# Patient Record
Sex: Male | Born: 1950 | Race: White | Hispanic: No
Health system: Southern US, Community
[De-identification: ages and names within clinical notes are randomized; demographics above are authoritative.]

## PROBLEM LIST (undated history)

## (undated) DIAGNOSIS — I639 Cerebral infarction, unspecified: Secondary | ICD-10-CM

---

## 2015-11-25 ENCOUNTER — Encounter (HOSPITAL_COMMUNITY): Payer: Self-pay | Admitting: Internal Medicine

## 2015-11-25 ENCOUNTER — Inpatient Hospital Stay (HOSPITAL_COMMUNITY)
Admission: AD | Admit: 2015-11-25 | Discharge: 2015-11-29 | DRG: 064 | Disposition: A | Discharge status: Skilled Nursing Facility | Payer: Medicaid Other | Source: Other Acute Inpatient Hospital | Attending: Internal Medicine | Admitting: Internal Medicine

## 2015-11-25 DIAGNOSIS — N390 Urinary tract infection, site not specified: Secondary | ICD-10-CM | POA: Diagnosis present

## 2015-11-25 DIAGNOSIS — L8990 Pressure ulcer of unspecified site, unspecified stage: Secondary | ICD-10-CM | POA: Diagnosis present

## 2015-11-25 DIAGNOSIS — I639 Cerebral infarction, unspecified: Secondary | ICD-10-CM | POA: Diagnosis not present

## 2015-11-25 DIAGNOSIS — R0600 Dyspnea, unspecified: Secondary | ICD-10-CM | POA: Diagnosis not present

## 2015-11-25 DIAGNOSIS — I6340 Cerebral infarction due to embolism of unspecified cerebral artery: Principal | ICD-10-CM | POA: Diagnosis present

## 2015-11-25 DIAGNOSIS — R059 Cough, unspecified: Secondary | ICD-10-CM

## 2015-11-25 DIAGNOSIS — I10 Essential (primary) hypertension: Secondary | ICD-10-CM | POA: Diagnosis present

## 2015-11-25 DIAGNOSIS — R627 Adult failure to thrive: Secondary | ICD-10-CM | POA: Diagnosis present

## 2015-11-25 DIAGNOSIS — R269 Unspecified abnormalities of gait and mobility: Secondary | ICD-10-CM | POA: Diagnosis not present

## 2015-11-25 DIAGNOSIS — I63412 Cerebral infarction due to embolism of left middle cerebral artery: Secondary | ICD-10-CM | POA: Diagnosis not present

## 2015-11-25 DIAGNOSIS — Z6820 Body mass index (BMI) 20.0-20.9, adult: Secondary | ICD-10-CM | POA: Diagnosis not present

## 2015-11-25 DIAGNOSIS — M40209 Unspecified kyphosis, site unspecified: Secondary | ICD-10-CM | POA: Diagnosis present

## 2015-11-25 DIAGNOSIS — F0390 Unspecified dementia without behavioral disturbance: Secondary | ICD-10-CM | POA: Diagnosis present

## 2015-11-25 DIAGNOSIS — E43 Unspecified severe protein-calorie malnutrition: Secondary | ICD-10-CM | POA: Diagnosis present

## 2015-11-25 DIAGNOSIS — R64 Cachexia: Secondary | ICD-10-CM | POA: Diagnosis present

## 2015-11-25 DIAGNOSIS — G912 (Idiopathic) normal pressure hydrocephalus: Secondary | ICD-10-CM | POA: Diagnosis present

## 2015-11-25 DIAGNOSIS — Z720 Tobacco use: Secondary | ICD-10-CM | POA: Diagnosis present

## 2015-11-25 DIAGNOSIS — R930 Abnormal findings on diagnostic imaging of skull and head, not elsewhere classified: Secondary | ICD-10-CM | POA: Diagnosis not present

## 2015-11-25 DIAGNOSIS — R54 Age-related physical debility: Secondary | ICD-10-CM | POA: Diagnosis present

## 2015-11-25 DIAGNOSIS — Z8619 Personal history of other infectious and parasitic diseases: Secondary | ICD-10-CM

## 2015-11-25 DIAGNOSIS — R05 Cough: Secondary | ICD-10-CM

## 2015-11-25 DIAGNOSIS — J449 Chronic obstructive pulmonary disease, unspecified: Secondary | ICD-10-CM | POA: Diagnosis present

## 2015-11-25 DIAGNOSIS — R0602 Shortness of breath: Secondary | ICD-10-CM

## 2015-11-25 DIAGNOSIS — Z8673 Personal history of transient ischemic attack (TIA), and cerebral infarction without residual deficits: Secondary | ICD-10-CM | POA: Diagnosis not present

## 2015-11-25 DIAGNOSIS — E785 Hyperlipidemia, unspecified: Secondary | ICD-10-CM | POA: Diagnosis present

## 2015-11-25 DIAGNOSIS — R296 Repeated falls: Secondary | ICD-10-CM

## 2015-11-25 DIAGNOSIS — L89222 Pressure ulcer of left hip, stage 2: Secondary | ICD-10-CM | POA: Diagnosis present

## 2015-11-25 DIAGNOSIS — R2681 Unsteadiness on feet: Secondary | ICD-10-CM

## 2015-11-25 DIAGNOSIS — J439 Emphysema, unspecified: Secondary | ICD-10-CM | POA: Diagnosis present

## 2015-11-25 HISTORY — DX: Cerebral infarction, unspecified: I63.9

## 2015-11-25 MED ORDER — ALPRAZOLAM 0.5 MG PO TABS
0.5000 mg | ORAL_TABLET | Freq: Once | ORAL | Status: AC | PRN
  Administered 2015-11-26: 0.5 mg via ORAL
  Filled 2015-11-25: qty 1

## 2015-11-25 NOTE — H&P (Addendum)
[facility] History and Physical  Cody NoonStephan Labell ZOX:[id number] DOB: 12/14/1950 DOA: 11/25/2015  Referring physician: [facility]. PCP: No primary care provider on file.   Chief Complaint: Abnormal CT scan.  HPI: Cody Lloyd is a 65 y.o. male with a past medical history of, secondary to his stroke with subsequent decreasing intellectual abilities who was [facility] by [facility] for questionable NPH finding in CT scan of the head.  [facility], patient was seen for the second time in the last 3 days in the emergency department. Earlier today, one of his neighbors was concerned, because the patient was not answering his daughter. Subsequently neighbor called help and the patient was found over in urine and feces on the floor. He apparently has been having frequent falls lately. At his baseline, the patient was independent enough to drive a vehicle and do grocery shopping, but apparently has not been eating much lately and seems to be unable to live independently at this time.  [facility] showed a negative CT chest angiogram for PE or pneumonia with no interval change from the one done earlier this week. His CT scan of the head showed involutional changes with prominent ventricles, NPH could not be ruled out. He was transferred to this facility for further evaluation and neurology consult.   Review of Systems:  Unable to review.  History reviewed. No pertinent past medical history. History reviewed. No pertinent past surgical history. Social History:  has no tobacco, alcohol, and drug history on file.  Allergies not on file  History reviewed. No pertinent family history.   Prior to Admission medications   Not on File   Physical Exam: Filed Vitals:   11/25/15 2200  BP: 119/63  Pulse: 72  Temp: 98.1 F (36.7 C)  TempSrc: Oral  Resp: 22  SpO2: 100%    Wt Readings from Last 3 Encounters:  No data found  for Wt    General:  Appears calm and comfortable. Poor physical hygiene.  Eyes: PERRL, normal lids, irises & conjunctiva ENT: grossly normal hearing, lips & tongue Neck: no LAD, masses or thyromegaly Cardiovascular: RRR, no m/r/g. No LE edema. Telemetry: SR, no arrhythmias  Respiratory: Decreased breath sounds at the bases, mildly prolonged expiratory phase, Mild wheezing bilaterally, no accessory muscle use. Abdomen: soft, ntnd Skin: Left hip area dressing in place. Musculoskeletal: grossly normal tone BUE/BLE Psychiatric: grossly normal mood and affect, speech fluent and appropriate Neurologic: Awake, alert, oriented 2, disoriented to time, grossly non-focal.          Labs on Admission:    Assessment/Plan Principal Problem:   Failure to thrive in adult Admit to telemetry/inpatient. Continue supplemental oxygen. Consult social services and care management in a.m.  Active Problems:   Emphysema/COPD (HCC) Continue supplemental oxygen. Bronchodilators as needed.    Tobacco abuse disorder The patient declined nicotine replacement therapy.    Frequent falls Physical therapy evaluation in a.m. Care management and social services consults.    Abnormal CT scan of head Discussed possible NPH findings with Dr. Amada JupiterKirkpatrick, neuro hospitalist, who recommended to evaluate with MRI of the head first and call the neuro hospitalist service, If MRI shows some abnormalities.    Left hip pressure ulcer. Continue local care.   Code Status: Full code. DVT Prophylaxis: Lovenox SQ. Family Communication:  Disposition Plan: Admit for further evaluation and treatment.  Time spent: Over 70 minutes were spent in the process of this admission.  Bobette Moavid Manuel Ortiz, M.D. [facility] Pager [phone].

## 2015-11-26 ENCOUNTER — Ambulatory Visit (HOSPITAL_COMMUNITY): Payer: Medicaid Other

## 2015-11-26 ENCOUNTER — Encounter (HOSPITAL_COMMUNITY): Payer: Self-pay | Admitting: Neurology

## 2015-11-26 ENCOUNTER — Inpatient Hospital Stay (HOSPITAL_COMMUNITY): Payer: Medicaid Other

## 2015-11-26 DIAGNOSIS — L8990 Pressure ulcer of unspecified site, unspecified stage: Secondary | ICD-10-CM | POA: Diagnosis present

## 2015-11-26 DIAGNOSIS — R627 Adult failure to thrive: Secondary | ICD-10-CM

## 2015-11-26 DIAGNOSIS — I639 Cerebral infarction, unspecified: Secondary | ICD-10-CM

## 2015-11-26 DIAGNOSIS — I63412 Cerebral infarction due to embolism of left middle cerebral artery: Secondary | ICD-10-CM

## 2015-11-26 DIAGNOSIS — R930 Abnormal findings on diagnostic imaging of skull and head, not elsewhere classified: Secondary | ICD-10-CM | POA: Diagnosis present

## 2015-11-26 LAB — COMPREHENSIVE METABOLIC PANEL
ALBUMIN: 2.7 g/dL — AB (ref 3.5–5.0)
ALT: 33 U/L (ref 17–63)
ANION GAP: 9 (ref 5–15)
AST: 34 U/L (ref 15–41)
Alkaline Phosphatase: 62 U/L (ref 38–126)
BILIRUBIN TOTAL: 0.6 mg/dL (ref 0.3–1.2)
BUN: 13 mg/dL (ref 6–20)
CHLORIDE: 99 mmol/L — AB (ref 101–111)
CO2: 29 mmol/L (ref 22–32)
Calcium: 8.8 mg/dL — ABNORMAL LOW (ref 8.9–10.3)
Creatinine, Ser: 0.45 mg/dL — ABNORMAL LOW (ref 0.61–1.24)
GFR calc Af Amer: >60 mL/min (ref >60)
GLUCOSE: 92 mg/dL (ref 65–99)
POTASSIUM: 3.7 mmol/L (ref 3.5–5.1)
Sodium: 137 mmol/L (ref 135–145)
Total Protein: 5.6 g/dL — ABNORMAL LOW (ref 6.5–8.1)

## 2015-11-26 LAB — LIPID PANEL
Cholesterol: 144 mg/dL (ref 0–200)
HDL: 45 mg/dL (ref >40)
LDL CALC: 89 mg/dL (ref 0–99)
TRIGLYCERIDES: 50 mg/dL (ref <150)
Total CHOL/HDL Ratio: 3.2 RATIO
VLDL: 10 mg/dL (ref 0–40)

## 2015-11-26 LAB — VITAMIN B12: VITAMIN B 12: 393 pg/mL (ref 180–914)

## 2015-11-26 LAB — CBC
HEMATOCRIT: 34.3 % — AB (ref 39.0–52.0)
Hemoglobin: 11.2 g/dL — ABNORMAL LOW (ref 13.0–17.0)
MCH: 30.2 pg (ref 26.0–34.0)
MCHC: 32.7 g/dL (ref 30.0–36.0)
MCV: 92.5 fL (ref 78.0–100.0)
PLATELETS: 352 10*3/uL (ref 150–400)
RBC: 3.71 MIL/uL — ABNORMAL LOW (ref 4.22–5.81)
RDW: 14.5 % (ref 11.5–15.5)
WBC: 18.1 10*3/uL — AB (ref 4.0–10.5)

## 2015-11-26 LAB — URINALYSIS, ROUTINE W REFLEX MICROSCOPIC
Bilirubin Urine: NEGATIVE
Glucose, UA: NEGATIVE mg/dL
HGB URINE DIPSTICK: NEGATIVE
Ketones, ur: 15 mg/dL — AB
Nitrite: NEGATIVE
PH: 8 (ref 5.0–8.0)
Protein, ur: 30 mg/dL — AB
SPECIFIC GRAVITY, URINE: 1.023 (ref 1.005–1.030)

## 2015-11-26 LAB — MAGNESIUM: Magnesium: 1.6 mg/dL — ABNORMAL LOW (ref 1.7–2.4)

## 2015-11-26 LAB — URINE MICROSCOPIC-ADD ON: RBC / HPF: NONE SEEN RBC/hpf (ref 0–5)

## 2015-11-26 LAB — INFLUENZA PANEL BY PCR (TYPE A & B)
H1N1FLUPCR: NOT DETECTED
INFLAPCR: NEGATIVE
Influenza B By PCR: NEGATIVE

## 2015-11-26 LAB — TSH: TSH: 1.413 u[IU]/mL (ref 0.350–4.500)

## 2015-11-26 LAB — PHOSPHORUS: Phosphorus: 2.7 mg/dL (ref 2.5–4.6)

## 2015-11-26 MED ORDER — ALBUTEROL SULFATE (2.5 MG/3ML) 0.083% IN NEBU: 2.5000 mg | INHALATION_SOLUTION | RESPIRATORY_TRACT | Status: DC | PRN

## 2015-11-26 MED ORDER — DEXTROSE 5 % IV SOLN
1.0000 g | INTRAVENOUS | Status: DC
  Administered 2015-11-26 – 2015-11-27 (×2): 1 g via INTRAVENOUS
  Filled 2015-11-26 (×3): qty 10

## 2015-11-26 MED ORDER — ASPIRIN EC 325 MG PO TBEC
325.0000 mg | DELAYED_RELEASE_TABLET | Freq: Every day | ORAL | Status: DC
  Administered 2015-11-26 – 2015-11-29 (×4): 325 mg via ORAL
  Filled 2015-11-26 (×4): qty 1

## 2015-11-26 MED ORDER — STROKE: EARLY STAGES OF RECOVERY BOOK
Freq: Once | Status: AC
  Administered 2015-11-26: 07:00:00
  Filled 2015-11-26: qty 1

## 2015-11-26 MED ORDER — INFLUENZA VAC SPLIT QUAD 0.5 ML IM SUSY
0.5000 mL | PREFILLED_SYRINGE | INTRAMUSCULAR | Status: AC
  Administered 2015-11-29: 0.5 mL via INTRAMUSCULAR

## 2015-11-26 MED ORDER — PRO-STAT SUGAR FREE PO LIQD
30.0000 mL | Freq: Three times a day (TID) | ORAL | Status: DC
Start: 2015-11-26 — End: 2015-11-29
  Administered 2015-11-26 – 2015-11-29 (×8): 30 mL via ORAL
  Filled 2015-11-26 (×7): qty 30

## 2015-11-26 MED ORDER — PNEUMOCOCCAL VAC POLYVALENT 25 MCG/0.5ML IJ INJ
0.5000 mL | INJECTION | INTRAMUSCULAR | Status: DC
  Filled 2015-11-26: qty 1
  Filled 2015-11-26: qty 0.5

## 2015-11-26 MED ORDER — MAGNESIUM SULFATE 2 GM/50ML IV SOLN
2.0000 g | Freq: Once | INTRAVENOUS | Status: AC
  Administered 2015-11-26: 2 g via INTRAVENOUS
  Filled 2015-11-26: qty 50

## 2015-11-26 MED ORDER — IPRATROPIUM-ALBUTEROL 0.5-2.5 (3) MG/3ML IN SOLN: 3.0000 mL | RESPIRATORY_TRACT | Status: DC | PRN

## 2015-11-26 MED ORDER — IPRATROPIUM-ALBUTEROL 0.5-2.5 (3) MG/3ML IN SOLN: 3.0000 mL | Freq: Four times a day (QID) | RESPIRATORY_TRACT | Status: DC

## 2015-11-26 MED ORDER — ENOXAPARIN SODIUM 40 MG/0.4ML ~~LOC~~ SOLN
40.0000 mg | SUBCUTANEOUS | Status: DC
  Administered 2015-11-26 – 2015-11-28 (×3): 40 mg via SUBCUTANEOUS
  Filled 2015-11-26 (×2): qty 0.4

## 2015-11-26 MED ORDER — ENSURE ENLIVE PO LIQD
237.0000 mL | Freq: Two times a day (BID) | ORAL | Status: DC
  Administered 2015-11-27 – 2015-11-28 (×4): 237 mL via ORAL

## 2015-11-26 MED ORDER — SODIUM CHLORIDE 0.9% FLUSH
3.0000 mL | Freq: Two times a day (BID) | INTRAVENOUS | Status: DC
  Administered 2015-11-26 – 2015-11-28 (×3): 3 mL via INTRAVENOUS

## 2015-11-26 MED ORDER — ATORVASTATIN CALCIUM 40 MG PO TABS
40.0000 mg | ORAL_TABLET | Freq: Every day | ORAL | Status: DC
  Administered 2015-11-26 – 2015-11-29 (×4): 40 mg via ORAL
  Filled 2015-11-26 (×4): qty 1

## 2015-11-26 NOTE — Progress Notes (Signed)
Occupational Therapy Evaluation Patient Details Name: Cody Lloyd MRN: [id number] DOB: 11/18/1950 Today's Date: 11/26/2015    History of Present Illness Cody Lloyd is a 65 y.o. male with a past medical history of, secondary to his stroke with subsequent decreasing intellectual abilities who was referred by [facility] for questionable NPH finding in CT scan of the head.   Clinical Impression   PTA, pt apparently living at home. Pt unsafe and unable to manage at home at this time and will need SNF for rehab. Limited eval due to lethargy. Will follow acutely and initially focus on self feeding goals.     Follow Up Recommendations  SNF;Supervision/Assistance - 24 hour    Equipment Recommendations  Other (comment) (TBA at SNF)    Recommendations for Other Services       Precautions / Restrictions Precautions Precautions: Fall Restrictions Weight Bearing Restrictions: No      Mobility Bed Mobility   Bed Mobility: Supine to Sit;Sit to Supine Rolling: Mod assist   Supine to sit: Mod assist  Pt sat for @ 2 min they reclined dispite encouragement to participate with OT        Transfers Overall transfer level: Needs assistance Equipment used:  (2 person lift with gait belt)             General transfer comment: Unable to get pt to participate in further mobiliy. Per PT,pt Max A +2    Balance     Sitting balance-Leahy Scale: Fair                                      ADL Overall ADL's : Needs assistance/impaired                                       General ADL Comments: Pt washed face then threw cloth on floor. Unable ot engage pt in any other ADL task. Pt did sit EOB with mod A.      Vision     Perception     Praxis      Pertinent Vitals/Pain Pain Assessment: Faces Faces Pain Scale: No hurt     Hand Dominance Right   Extremity/Trunk Assessment Upper Extremity Assessment Upper Extremity Assessment:  Generalized weakness   Lower Extremity Assessment Lower Extremity Assessment: Generalized weakness   Cervical / Trunk Assessment Cervical / Trunk Assessment: Kyphotic   Communication Communication Communication: HOH   Cognition Arousal/Alertness: Lethargic Behavior During Therapy: Flat affect Overall Cognitive Status: Impaired/Different from baseline Area of Impairment: Orientation;Attention;Memory;Following commands;Safety/judgement;Awareness;Problem solving Orientation Level: Disoriented to;Time;Situation;Place Current Attention Level: Focused Memory: Decreased short-term memory Following Commands: Follows one step commands inconsistently;Follows one step commands with increased time Safety/Judgement: Decreased awareness of safety;Decreased awareness of deficits Awareness: Intellectual Problem Solving: Slow processing;Decreased initiation;Difficulty sequencing;Requires verbal cues;Requires tactile cues General Comments: Pt more lethargic this pm.    General Comments       Exercises       Shoulder Instructions      Home Living Family/patient expects to be discharged to:: Skilled nursing facility Living Arrangements: Alone                               Additional Comments: per sister Cody Lloyd, pt living alone but wouldn't let her in for the  last 6 months. per sister home has a flight of stairs to enter and is a disaster, will need a hazmat team      Prior Functioning/Environment Level of Independence: Needs assistance  Gait / Transfers Assistance Needed: per sister pt was ambulating without AD ADL's / Homemaking Assistance Needed: tubshower but wasn't bathing, water was shut off for last 2 weeks   Comments: didn't drive, was having neighbors do grocery shopping    OT Diagnosis: Generalized weakness;Cognitive deficits;Altered mental status   OT Problem List: Decreased strength;Decreased activity tolerance;Impaired balance (sitting and/or standing);Decreased  coordination;Decreased cognition;Decreased safety awareness;Decreased knowledge of use of DME or AE   OT Treatment/Interventions: Self-care/ADL training;Neuromuscular education;Therapeutic exercise;Therapeutic activities;Cognitive remediation/compensation;Patient/family education;Balance training    OT Goals(Current goals can be found in the care plan section) Acute Rehab OT Goals Patient Stated Goal: didn't state OT Goal Formulation: Patient unable to participate in goal setting Time For Goal Achievement: 12/10/15 Potential to Achieve Goals: Good  OT Frequency: Min 2X/week   Barriers to D/C: Decreased caregiver support          Co-evaluation              End of Session Nurse Communication: Mobility status  Activity Tolerance: Patient limited by lethargy Patient left: in bed;with call bell/phone within reach;with bed alarm set   Time: 1610-9604 OT Time Calculation (min): 14 min Charges:  OT General Charges $OT Visit: 1 Procedure OT Evaluation $OT Eval Moderate Complexity: 1 Procedure G-Codes:    Phynix Horton,HILLARY 2015/12/25, 4:40 PM  [facility], OTR/L  [phone] 12/25/15

## 2015-11-26 NOTE — Evaluation (Signed)
Clinical/Bedside Swallow Evaluation Patient Details  Name: Cody Lloyd MRN: [id number] Date of Birth: 08-07-1951  Today's Date: 11/26/2015 Time: SLP Start Time (ACUTE ONLY): 1410 SLP Stop Time (ACUTE ONLY): 1433 SLP Time Calculation (min) (ACUTE ONLY): 23 min  Past Medical History:  Past Medical History  Diagnosis Date  . Stroke (cerebrum) [facility])    Past Surgical History: History reviewed. No pertinent past surgical history. HPI:  Cody Lloyd is a 65 y.o. male with a past medical history of decreasing intellectual abilities secondary to stroke who was referred by [facility] for questionable NPH finding in CT scan of the head.  Per [facility] records, patient was seen for the second time in the last 3 days in the emergency department. Earlier today, one of his neighbors was concerned, because the patient was not answering his daughter. Subsequently neighbor called help and the patient was found down in urine and feces on the floor. He apparently has been having frequent falls lately. At his baseline, the patient was independent enough to drive a vehicle and do grocery shopping, but apparently has not been eating much lately and seems to be unable to live independently at this time.  Workup at [facility] showed a negative CT chest angiogram for PE or pneumonia with no interval change from the one done earlier this week. His CT scan of the head showed involutional changes with prominent ventricles, NPH could not be ruled out. He was transferred to this facility for further evaluation and neurology consult.  MRI dated 11/26/2015 showing punctate probable acute infarct LEFT centrum semiovale,  moderate ventriculomegaly, consistent with mild brain atrophy for age and, a component normal pressure hydrocephalus, bifrontal encephalomalacia in a pattern compatible with prior head injury and moderate to severe chronic small vessel ischemic disease.   Assessment / Plan /  Recommendation Clinical Impression  Clinical swallowing evaluation was completed.  The patient demosntrated very mild oropharyngeal dysphagia characterized by mildly delayed swallow trigger and mildly delayed oral transit for dry solids.  Overt s/s of aspiration were not observed.  Given the patient's current mental status he is at risk for aspiration.  Recommend a regular diet and thin liquids.  ST to follow up for therapeutic diet tolerance.  MD please consider speech/language evaluation to fully assess cognition.  Thank you.     Aspiration Risk  Mild aspiration risk    Diet Recommendation   Regular diet with thin liquids.    Medication Administration: Whole meds with puree    Other  Recommendations Oral Care Recommendations: Oral care BID   Follow up Recommendations   (TBD)    Frequency and Duration min 2x/week  2 weeks       Prognosis Prognosis for Safe Diet Advancement: Good Barriers to Reach Goals: Cognitive deficits      Swallow Study   General Date of Onset: 11/25/15 HPI: Cody Lloyd is a 65 y.o. male with a past medical history of decreasing intellectual abilities secondary to stroke who was referred by [facility] for questionable NPH finding in CT scan of the head.  Per [facility] records, patient was seen for the second time in the last 3 days in the emergency department. Earlier today, one of his neighbors was concerned, because the patient was not answering his daughter. Subsequently neighbor called help and the patient was found down in urine and feces on the floor. He apparently has been having frequent falls lately. At his baseline, the patient was independent enough to drive a vehicle  and do grocery shopping, but apparently has not been eating much lately and seems to be unable to live independently at this time.  [facility] showed a negative CT chest angiogram for PE or pneumonia with no interval change from the one done earlier this  week. His CT scan of the head showed involutional changes with prominent ventricles, NPH could not be ruled out. He was transferred to this facility for further evaluation and neurology consult.  MRI dated 11/26/2015 showing punctate probable acute infarct LEFT centrum semiovale,  moderate ventriculomegaly, consistent with mild brain atrophy for age and, a component normal pressure hydrocephalus, bifrontal encephalomalacia in a pattern compatible with prior head injury and moderate to severe chronic small vessel ischemic disease. Type of Study: Bedside Swallow Evaluation Previous Swallow Assessment: None noted.   Diet Prior to this Study: Regular;Thin liquids Temperature Spikes Noted: No Respiratory Status: Room air History of Recent Intubation: No Behavior/Cognition: Alert;Cooperative;Confused Oral Cavity Assessment: Within Functional Limits Oral Care Completed by SLP: No Oral Cavity - Dentition: Adequate natural dentition Vision: Functional for self-feeding Self-Feeding Abilities: Able to feed self Patient Positioning: Upright in bed Baseline Vocal Quality: Normal Volitional Cough: Strong Volitional Swallow: Able to elicit    Oral/Motor/Sensory Function Overall Oral Motor/Sensory Function: Within functional limits   Ice Chips Ice chips: Not tested   Thin Liquid Thin Liquid: Impaired Presentation: Cup;Spoon;Self Fed Pharyngeal  Phase Impairments: Suspected delayed Swallow    Nectar Thick Nectar Thick Liquid: Not tested   Honey Thick Honey Thick Liquid: Not tested   Puree Puree: Within functional limits Presentation: Spoon   Solid   GO   Solid: Impaired Presentation: Self Fed Oral Phase Impairments: Impaired mastication Oral Phase Functional Implications: Prolonged oral transit    Functional Assessment Tool Used: ASHA NOMS and clinical judgment.   Functional Limitations: Swallowing Swallow Current Status [phone](G8996): At least 1 percent but less than 20 percent impaired, limited or  restricted Swallow Goal Status [phone](G8997): At least 1 percent but less than 20 percent impaired, limited or restricted  Cody AguasMelissa Yaquelin Langelier, MA, CCC-SLP Acute Rehab SLP [phone] Cody AguasGoodman, Cody Billey N 11/26/2015,2:39 PM

## 2015-11-26 NOTE — Progress Notes (Signed)
Respiratory assessment done and patient scored a 6. Patient has history but takes no meds taken at home patient states. BBS heard at this time are clear throughout and diminished in the bases. Non productive cough at this time. Patient sating 96% on 2L/[place] and doing well. Assessed to PRN at this time and will reassess if needed.

## 2015-11-26 NOTE — Progress Notes (Signed)
Patient Demographics:    Cody Lloyd, is a 65 y.o. male, DOB - 12/04/1950, RUE:[id number]  Admit date - 11/25/2015   Admitting Physician Houston SirenPeter Le, MD  Outpatient Primary MD for the patient is No primary care provider on file.  LOS - 1   No chief complaint on file.       Subjective:    Cody Lloyd today has, No headache, No chest pain, No abdominal pain - No Nausea, No new weakness tingling or numbness, No Cough - SOB.    Assessment  & Plan :     1.Failure to thrive and generalized weakness due to acute left centrum semiovale stroke along with undiagnosed NPH. Patient appears frail and cachectic, appears to be poorly kept, apparently lives alone and has had multiple falls recently.  Has been placed on aspirin, Lipitor, full stroke workup, neuro called. LDL was greater than 70 hence started on statin, A1c pending, echogram and carotid duplex pending as well.   2. Failure to thrive, cachexia, multiple falls recently. PT, OT eval, place on nutritional supplements, appears to have severe protein calorie malnutrition as well. Will likely require placement.  3. Dyslipidemia. Placed on statin.  4. Dysuria with leukocytosis. Check UA, placed on Rocephin.  5. Hypomagnesemia. Replace IV and recheck.  6. Smoking. Counseled to quit.  7. History of COPD. Stable, supportive care only at this time. No wheezing.   8. Left hip pressure ulcer. Kindly see wound care note for details, one care consulted.    Code Status : Full  Family Communication  : None present  Disposition Plan  : SNF  Consults  :  Neuro  Procedures  :   MRI brain. Acute left centrum semiovale stroke. Likely mild NPH  Echogram  Carotid duplex  DVT Prophylaxis  :  Lovenox    Lab Results  Component Value Date   PLT  352 11/26/2015    Inpatient Medications  Scheduled Meds: .  stroke: mapping our early stages of recovery book   Does not apply Once  . aspirin EC  325 mg Oral Daily  . atorvastatin  40 mg Oral q1800  . cefTRIAXone (ROCEPHIN)  IV  1 g Intravenous Q24H  . enoxaparin (LOVENOX) injection  40 mg Subcutaneous Q24H  . [START ON 11/27/2015] Influenza vac split quadrivalent PF  0.5 mL Intramuscular Tomorrow-1000  . [START ON 11/27/2015] pneumococcal 23 valent vaccine  0.5 mL Intramuscular Tomorrow-1000  . sodium chloride flush  3 mL Intravenous Q12H   Continuous Infusions:  PRN Meds:.ipratropium-albuterol  Antibiotics  :     Anti-infectives    Start     Dose/Rate Route Frequency Ordered Stop   11/26/15 1200  cefTRIAXone (ROCEPHIN) 1 g in dextrose 5 % 50 mL IVPB     1 g 100 mL/hr over 30 Minutes Intravenous Every 24 hours 11/26/15 1020 12/01/15 1159        Objective:   Filed Vitals:   11/25/15 2200 11/26/15 0001 11/26/15 0826  BP: 119/63  126/67  Pulse: 72  83  Temp: 98.1 F (36.7 C)  97.7 F (36.5 C)  TempSrc: Oral  Oral  Resp: 22  17  SpO2: 100% 96% 95%    Wt Readings from Last 3 Encounters:  No  data found for Wt    No intake or output data in the 24 hours ending 11/26/15 1028   Physical Exam  Frail elderly, cachectic appearing white male  Awake, Pleasantly confused, No new F.N deficits, Normal affect [place].AT,PERRAL Supple Neck,No JVD, No cervical lymphadenopathy appriciated.  Symmetrical Chest wall movement, Good air movement bilaterally, CTAB RRR,No Gallops,Rubs or new Murmurs, No Parasternal Heave +ve B.Sounds, Abd Soft, No tenderness, No organomegaly appriciated, No rebound - guarding or rigidity. No Cyanosis, Clubbing or edema, No new Rash or bruise       Data Review:   Micro Results No results found for this or any previous visit (from the past 240 hour(s)).  Radiology Reports Mr Brain Wo Contrast  11/26/2015  CLINICAL DATA:  Decreasing intellectual  abilities, assess possible normal pressure hydrocephalus. Frequent falls, failure to thrive. EXAM: MRI HEAD WITHOUT CONTRAST TECHNIQUE: Multiplanar, multiecho pulse sequences of the brain and surrounding structures were obtained without intravenous contrast. COMPARISON:  CT head November 22, 2015 and November 23, 2015 FINDINGS: Multiple sequences are moderately motion degraded. Punctate focus of reduced diffusion LEFT frontal centrum semiovale, difficult to localize on ADC value due to mild patient motion and small size. No susceptibility artifact to suggest hemorrhage. Moderate to severe ventriculomegaly, disproportionate to degree of sulcal effacement. Patchy to confluent supratentorial/periventricular white matter T2 hyperintensities, in addition to mild pontine T2 hyperintense signal. Old small RIGHT cerebellar infarcts. Bifrontal multifocal encephalomalacia. No midline shift, mass effect or mass lesions though motion degrades sensitivity. No abnormal extra-axial fluid collections. Basal cisterns are patent. dolicoectatic intracranial vascular flow voids present at skullbase. Mild paranasal sinus mucosal thickening without air-fluid levels. The mastoid air cells are well aerated. No abnormal sellar expansion. No cerebellar tonsillar ectopia. No suspicious calvarial bone marrow signal. IMPRESSION: Moderately motion degraded examination. Punctate probable acute infarct LEFT centrum semiovale. Moderate ventriculomegaly, consistent with mild brain atrophy for age and, a component normal pressure hydrocephalus. Bifrontal encephalomalacia in a pattern compatible with prior head injury. Moderate to severe chronic small vessel ischemic disease. Electronically Signed   By: Awilda Metro M.D.   On: 11/26/2015 05:08   Dg Chest Port 1 View  11/26/2015  CLINICAL DATA:  Cough and shortness of breath. EXAM: PORTABLE CHEST 1 VIEW COMPARISON:  CT chest 11/25/2015. Single view of the chest 11/24/2015. FINDINGS: The lungs are  markedly emphysematous. Mild basilar atelectasis is noted. No pneumothorax or pleural effusion. Heart size is normal. IMPRESSION: Severe emphysema with bibasilar atelectasis. Electronically Signed   By: Drusilla Kanner M.D.   On: 11/26/2015 07:46     CBC  Recent Labs Lab 11/26/15 0702  WBC 18.1*  HGB 11.2*  HCT 34.3*  PLT 352  MCV 92.5  MCH 30.2  MCHC 32.7  RDW 14.5    Chemistries   Recent Labs Lab 11/26/15 0702  NA 137  K 3.7  CL 99*  CO2 29  GLUCOSE 92  BUN 13  CREATININE 0.45*  CALCIUM 8.8*  MG 1.6*  AST 34  ALT 33  ALKPHOS 62  BILITOT 0.6   ------------------------------------------------------------------------------------------------------------------  Recent Labs  11/26/15 0702  CHOL 144  HDL 45  LDLCALC 89  TRIG 50  CHOLHDL 3.2    No results found for: HGBA1C ------------------------------------------------------------------------------------------------------------------  Recent Labs  11/26/15 0702  TSH 1.413   ------------------------------------------------------------------------------------------------------------------  Recent Labs  11/26/15 0702  VITAMINB12 393    Coagulation profile No results for input(s): INR, PROTIME in the last 168 hours.  No results for input(s): DDIMER in  the last 72 hours.  Cardiac Enzymes No results for input(s): CKMB, TROPONINI, MYOGLOBIN in the last 168 hours.  Invalid input(s): CK ------------------------------------------------------------------------------------------------------------------ No results found for: BNP  Time Spent in minutes  35   Susa Raring K M.D on 11/26/2015 at 10:28 AM  Between 7am to 7pm - Pager - [phone]  After 7pm go to [email or web address] - password [facility]  [facility] -  Office  [phone]

## 2015-11-26 NOTE — Consult Note (Signed)
Requesting Physician: Dr. Thedore Mins    Chief Complaint: Stroke  History obtained from:   Patient and Chart     HPI:                                                                                                                                         Cody Lloyd is an 65 y.o. male with a past medical history of, secondary to his stroke with subsequent decreasing intellectual abilities who was referred by [facility] for questionable NPH finding in CT scan of the head.  Per [facility] records, patient was seen for the second time in the last 3 days in the emergency department. Earlier today, one of his neighbors was concerned, because the patient was not answering his daughter. Subsequently neighbor called help and the patient was found over in urine and feces on the floor. He apparently has been having frequent falls lately. At his baseline, the patient was independent enough to drive a vehicle and do grocery shopping, but apparently has not been eating much lately and seems to be unable to live independently at this time.  Workup [facility] showed a negative CT chest angiogram for PE or pneumonia with no interval change from the one done earlier this week. His CT scan of the head showed involutional changes with prominent ventricles, NPH could not be ruled out. On arrival to [facility] brain was obtained and showed a punctate stroke. Neurology was asked to evaluate.   Currently in room and not aware he is in [place] or hospital. Believes he is in [place]. Able to follow commands.   Date last known well: Unable to determine Time last known well: Unable to determine tPA Given: No: no known LSN     Past Medical History  Diagnosis Date  . Stroke (cerebrum) [facility])     History reviewed. No pertinent past surgical history.  Family History  Problem Relation Age of Onset  . Hypertension Mother   . Hypertension Father    Social History:  has no tobacco, alcohol, and  drug history on file.  Allergies: No Known Allergies  Medications:                                                                                                                           Prior to Admission:  No prescriptions prior to admission  Scheduled: .  stroke: mapping our early stages of recovery book   Does not apply Once  . aspirin EC  325 mg Oral Daily  . cefTRIAXone (ROCEPHIN)  IV  1 g Intravenous Q24H  . enoxaparin (LOVENOX) injection  40 mg Subcutaneous Q24H  . [START ON 11/27/2015] Influenza vac split quadrivalent PF  0.5 mL Intramuscular Tomorrow-1000  . [START ON 11/27/2015] pneumococcal 23 valent vaccine  0.5 mL Intramuscular Tomorrow-1000  . sodium chloride flush  3 mL Intravenous Q12H    ROS:                                                                                                                                       History obtained from the patient  General ROS: negative for - chills, fatigue, fever, night sweats, weight gain or weight loss Psychological ROS: negative for - behavioral disorder, hallucinations, memory difficulties, mood swings or suicidal ideation Ophthalmic ROS: negative for - blurry vision, double vision, eye pain or loss of vision ENT ROS: negative for - epistaxis, nasal discharge, oral lesions, sore throat, tinnitus or vertigo Allergy and Immunology ROS: negative for - hives or itchy/watery eyes Hematological and Lymphatic ROS: negative for - bleeding problems, bruising or swollen lymph nodes Endocrine ROS: negative for - galactorrhea, hair pattern changes, polydipsia/polyuria or temperature intolerance Respiratory ROS: negative for - cough, hemoptysis, shortness of breath or wheezing Cardiovascular ROS: negative for - chest pain, dyspnea on exertion, edema or irregular heartbeat Gastrointestinal ROS: negative for - abdominal pain, diarrhea, hematemesis, nausea/vomiting or stool incontinence Genito-Urinary ROS: negative for - dysuria,  hematuria, incontinence or urinary frequency/urgency Musculoskeletal ROS: negative for - joint swelling or muscular weakness Neurological ROS: as noted in HPI Dermatological ROS: negative for rash and skin lesion changes  Neurologic Examination:                                                                                                      Blood pressure 126/67, pulse 83, temperature 97.7 F (36.5 C), temperature source Oral, resp. rate 17, SpO2 95 %.  HEENT-  Normocephalic, no lesions, without obvious abnormality.  Normal external eye and conjunctiva.  Normal TM's bilaterally.  Normal auditory canals and external ears. Normal external nose, mucus membranes and septum.  Normal pharynx. Cardiovascular- S1, S2 normal, pulses palpable throughout   Lungs- chest clear, no wheezing, rales, normal symmetric air entry Abdomen- normal findings: bowel sounds normal Extremities- no edema Lymph-no adenopathy palpable Musculoskeletal-no joint tenderness, deformity or swelling  Skin-warm and dry, no hyperpigmentation, vitiligo, or suspicious lesions  Neurological Examination Mental Status: Alert, not oriented to place, month, year and when asked why he is here he states "because I am sick". Speech fluent without evidence of aphasia.  Able to follow simple commands without difficulty. Cranial Nerves: II:  Visual fields grossly normal, pupils equal, round, reactive to light and accommodation III,IV, VI: ptosis not present, extra-ocular motions intact bilaterally V,VII: smile asymmetric on the left, facial light touch sensation normal bilaterally VIII: hearing normal bilaterally IX,X: uvula rises symmetrically XI: bilateral shoulder shrug XII: midline tongue extension Motor: Right : Upper extremity   5/5    Left:     Upper extremity   5/5  Lower extremity   5/5     Lower extremity   5/5 Tone and bulk:normal tone throughout; no atrophy noted Sensory: Pinprick and light touch intact throughout,  bilaterally Deep Tendon Reflexes: 2+ and symmetric throughout Plantars: Right: downgoing   Left: downgoing Cerebellar: normal finger-to-nose,  Gait: not tested       Lab Results: Basic Metabolic Panel:  Recent Labs Lab 11/26/15 0702  NA 137  K 3.7  CL 99*  CO2 29  GLUCOSE 92  BUN 13  CREATININE 0.45*  CALCIUM 8.8*  MG 1.6*    Liver Function Tests:  Recent Labs Lab 11/26/15 0702  AST 34  ALT 33  ALKPHOS 62  BILITOT 0.6  PROT 5.6*  ALBUMIN 2.7*   No results for input(s): LIPASE, AMYLASE in the last 168 hours. No results for input(s): AMMONIA in the last 168 hours.  CBC:  Recent Labs Lab 11/26/15 0702  WBC 18.1*  HGB 11.2*  HCT 34.3*  MCV 92.5  PLT 352    Cardiac Enzymes: No results for input(s): CKTOTAL, CKMB, CKMBINDEX, TROPONINI in the last 168 hours.  Lipid Panel:  Recent Labs Lab 11/26/15 0702  CHOL 144  TRIG 50  HDL 45  CHOLHDL 3.2  VLDL 10  LDLCALC 89    CBG: No results for input(s): GLUCAP in the last 168 hours.  Microbiology: No results found for this or any previous visit.  Coagulation Studies: No results for input(s): LABPROT, INR in the last 72 hours.  Imaging: Mr Sherrin Daisy Contrast  11/26/2015  CLINICAL DATA:  Decreasing intellectual abilities, assess possible normal pressure hydrocephalus. Frequent falls, failure to thrive. EXAM: MRI HEAD WITHOUT CONTRAST TECHNIQUE: Multiplanar, multiecho pulse sequences of the brain and surrounding structures were obtained without intravenous contrast. COMPARISON:  CT head November 22, 2015 and November 23, 2015 FINDINGS: Multiple sequences are moderately motion degraded. Punctate focus of reduced diffusion LEFT frontal centrum semiovale, difficult to localize on ADC value due to mild patient motion and small size. No susceptibility artifact to suggest hemorrhage. Moderate to severe ventriculomegaly, disproportionate to degree of sulcal effacement. Patchy to confluent  supratentorial/periventricular white matter T2 hyperintensities, in addition to mild pontine T2 hyperintense signal. Old small RIGHT cerebellar infarcts. Bifrontal multifocal encephalomalacia. No midline shift, mass effect or mass lesions though motion degrades sensitivity. No abnormal extra-axial fluid collections. Basal cisterns are patent. dolicoectatic intracranial vascular flow voids present at skullbase. Mild paranasal sinus mucosal thickening without air-fluid levels. The mastoid air cells are well aerated. No abnormal sellar expansion. No cerebellar tonsillar ectopia. No suspicious calvarial bone marrow signal. IMPRESSION: Moderately motion degraded examination. Punctate probable acute infarct LEFT centrum semiovale. Moderate ventriculomegaly, consistent with mild brain atrophy for age and, a component normal pressure hydrocephalus. Bifrontal encephalomalacia in a pattern compatible with prior  head injury. Moderate to severe chronic small vessel ischemic disease. Electronically Signed   By: Awilda Metroourtnay  Bloomer M.D.   On: 11/26/2015 05:08   Dg Chest Port 1 View  11/26/2015  CLINICAL DATA:  Cough and shortness of breath. EXAM: PORTABLE CHEST 1 VIEW COMPARISON:  CT chest 11/25/2015. Single view of the chest 11/24/2015. FINDINGS: The lungs are markedly emphysematous. Mild basilar atelectasis is noted. No pneumothorax or pleural effusion. Heart size is normal. IMPRESSION: Severe emphysema with bibasilar atelectasis. Electronically Signed   By: Drusilla Kannerhomas  Dalessio M.D.   On: 11/26/2015 07:46       Assessment and plan discussed with with attending physician and they are in agreement.    Felicie MornDavid Smith PA-C [facility] [phone]  11/26/2015, 10:20 AM   Assessment: 65 y.o. male with punctate acute left centrum semioavale infarct in the setting of leukocytosis and probable UTI. His AMS is likely not secondary to his infarct.    Seen with Dr. Lavon PaganiniNandigam. Please see his attestation note for A/P for  any additional work up recommendations.   Recommend: 1. HgbA1c, fasting lipid panel 2. MRI, MRA  of the brain without contrast 3. PT consult, OT consult, Speech consult 4. Echocardiogram 5. Carotid dopplers 6. Prophylactic therapy-Antiplatelet med: Aspirin - dose 325 mg daily 7. Risk factor modification 8. Telemetry monitoring 9. Frequent neuro checks 10 NPO until passes stroke swallow screen 11 please page stroke NP  Or  PA  Or MD  M-F from 8am -4 pm starting 3.14.2017  by the stroke team at this point.   You can look them up on [email or web address]  Password [email or web address]    Stroke Risk Factors - smoking

## 2015-11-26 NOTE — Progress Notes (Signed)
Initial Nutrition Assessment  DOCUMENTATION CODES:   Severe malnutrition in context of chronic illness  INTERVENTION:  Continue 30 ml Prostat po TID, each supplement provides 100 kcal and 15 grams of protein.   Provide Ensure Enlive po BID, each supplement provides 350 kcal and 20 grams of protein  Recommend obtaining new height measurement.  Encourage adequate PO intake.   NUTRITION DIAGNOSIS:   Malnutrition related to chronic illness as evidenced by severe depletion of body fat, severe depletion of muscle mass.  GOAL:   Patient will meet greater than or equal to 90% of their needs  MONITOR:   PO intake, Supplement acceptance, Weight trends, Labs, I & O's, Skin  REASON FOR ASSESSMENT:   Malnutrition Screening Tool    ASSESSMENT:   65 y.o. male with a past medical history of, secondary to his stroke with subsequent decreasing intellectual abilities who was referred by [facility] for questionable NPH finding in CT scan of the head. PMH COPD, FTT.  No percent meal completion recorded. Pt reports 25% intake at meals today. Pt reports usually consuming 2-3 meals a day with an Ensure on occasion at home. Noted no new weight or height measurement recorded. Pt was weighed on bed during time of visit which revealed 122 lbs. Pt reports weight has been stable. Pt was asked what his height is however pt unable to answer. Recommend obtaining new height measurement. Pt has Prostat ordered. RD to continue with orders as well as additionally order Ensure. Pt encouraged to eat his food at meals and to take his supplements.   Nutrition-Focused physical exam completed. Findings are severe fat depletion, severe muscle depletion, and no edema.   Labs: Low chloride, creatinine, magnesium and calcium.  Diet Order:  Diet regular Room service appropriate?: Yes; Fluid consistency:: Thin  Skin:  Wound (see comment) (Stage 2 ulcer on L hip)  Last BM:  3/13  Height:   Ht Readings from  Last 1 Encounters:  No data found for Ht    Weight:   Wt Readings from Last 1 Encounters:  11/26/15 122 lb (55.339 kg)    Ideal Body Weight:   N/A  BMI:  There is no height on file to calculate BMI.  Estimated Nutritional Needs:   Kcal:  1900-2100  Protein:  85-100 grams  Fluid:  1.9 - 2.1 L/day  EDUCATION NEEDS:   No education needs identified at this time  Roslyn SmilingStephanie Kyara Boxer, MS, RD, LDN Pager # [phone] After hours/ weekend pager # [phone]

## 2015-11-26 NOTE — Progress Notes (Signed)
Preliminary results by tech - Carotid Duplex Completed. 1-39% stenosis bilaterally. Vertebral arteries demonstrate antegrade flow. Cody Lloyd, BS, RDMS, RVT

## 2015-11-26 NOTE — Care Management Note (Signed)
Case Management Note  Patient Details  Name: Cody Lloyd MRN: [id number] Date of Birth: 08/21/1951  Subjective/Objective:              CM following for progression and d/c planning.      Action/Plan: 11/26/2015 Noted CM referral as pt is home alone and having frequent falls. This CM noted that pt with recent hx of inability to live independently and frequent falls. Per report pt now with new CVA. Await PT and OT eval and recommendations and will assist at that time based upon pt needs.   Expected Discharge Date:                  Expected Discharge Plan:  Skilled Nursing Facility  In-House Referral:  Clinical Social Work  Discharge planning Services  CM Consult  Post Acute Care Choice:    Choice offered to:     DME Arranged:    DME Agency:     HH Arranged:    HH Agency:     Status of Service:  In process, will continue to follow  Medicare Important Message Given:    Date Medicare IM Given:    Medicare IM give by:    Date Additional Medicare IM Given:    Additional Medicare Important Message give by:     If discussed at Long Length of Stay Meetings, dates discussed:    Additional Comments:  Starlyn SkeansRoyal, Kortnie Stovall U, RN 11/26/2015, 10:35 AM

## 2015-11-26 NOTE — Consult Note (Signed)
WOC wound consult note Reason for Consult: pressure injury Wound type: Stage 2 Pressure Injury Patient poor historian, per records patient found down at home, unknown amount of time.  Pressure Ulcer POA: Yes Measurement:3cm x 4cm x 0.2cm  Wound bed:50% pink with epithelial buds, 50% yellow but just fibrin Drainage (amount, consistency, odor) minimal, serous Periwound: intact. Dressing procedure/placement/frequency: continue silicone foam dressing to protect and insulate for moist wound healing.  I will reassess later in the week for any changes, however at this time it is moist and most of the yellow appears to be thin fibrin.    WOC will follow up later in the week for reassessment.  [place] Hijazi VarnaAustin RN, [place] [phone]

## 2015-11-26 NOTE — Evaluation (Signed)
Physical Therapy Evaluation Patient Details Name: Enio Hornback MRN: [id number] DOB: 1951-06-24 Today's Date: 11/26/2015   History of Present Illness  Helen Cuff is a 65 y.o. male with a past medical history of, secondary to his stroke with subsequent decreasing intellectual abilities who was referred by [facility] for questionable NPH finding in CT scan of the head. Neighbors heard patient fall and called EMS.  Clinical Impression  Pt admitted with above diagnosis. Pt currently with functional limitations due to the deficits listed below (see PT Problem List). Pt only oriented to self, demo'd impaired cognition, and requires assist x2 for safe OOB mobility. Pt unsafe to return home alone as he is at increased falls risk. Pt will benefit from skilled PT to increase their independence and safety with mobility to allow discharge to the venue listed below.       Follow Up Recommendations SNF;Supervision/Assistance - 24 hour    Equipment Recommendations  None recommended by PT    Recommendations for Other Services       Precautions / Restrictions Precautions Precautions: Fall Restrictions Weight Bearing Restrictions: No      Mobility  Bed Mobility Overal bed mobility: Needs Assistance Bed Mobility: Rolling;Supine to Sit Rolling: Min assist   Supine to sit: Min assist     General bed mobility comments: pt initiated transfer but required assist for trunk elevation  Transfers Overall transfer level: Needs assistance Equipment used:  (2 person lift with gait belt) Transfers: Sit to/from [employer] Transfers Sit to Stand: Max assist;+2 physical assistance Stand pivot transfers: Max assist;+2 physical assistance       General transfer comment: pt with retropulsion and with increased trunk flexion. pt required max tactile cues to [employer] weight shift to shuffle over to chair  Ambulation/Gait             General Gait Details: unable to amb  Stairs            Wheelchair Mobility    Modified Rankin (Stroke Patients Only) Modified Rankin (Stroke Patients Only) Pre-Morbid Rankin Score: Slight disability Modified Rankin: Moderately severe disability     Balance Overall balance assessment: Needs assistance;History of Falls Sitting-balance support: Feet supported;Bilateral upper extremity supported Sitting balance-Leahy Scale: Poor Sitting balance - Comments: pt sat EOB, increased trunk flexion   Standing balance support: Bilateral upper extremity supported Standing balance-Leahy Scale: Zero Standing balance comment: retropulsion                             Pertinent Vitals/Pain Pain Assessment: No/denies pain    Home Living Family/patient expects to be discharged to:: Skilled nursing facility Living Arrangements: Alone               Additional Comments: per sister Fulton Mole, pt living alone but wouldn't let her in for the last 6 months. per sister home has a flight of stairs to enter and is a disaster, will need a hazmat team    Prior Function Level of Independence: Needs assistance   Gait / Transfers Assistance Needed: per sister pt was ambulating without AD  ADL's / Homemaking Assistance Needed: tubshower but wasn't bathing, water was shut off for last 2 weeks  Comments: didn't drive, was having neighbors do grocery shopping     Hand Dominance   Dominant Hand: Right    Extremity/Trunk Assessment   Upper Extremity Assessment: Generalized weakness (L UE weaker than R)  Lower Extremity Assessment: Generalized weakness      Cervical / Trunk Assessment: Kyphotic  Communication   Communication: HOH  Cognition Arousal/Alertness: Awake/alert Behavior During Therapy: Flat affect Overall Cognitive Status: Impaired/Different from baseline Area of Impairment: Orientation;Attention;Memory;Following commands;Safety/judgement;Awareness;Problem solving Orientation Level: Disoriented  to;Time;Situation;Place Current Attention Level: Focused Memory: Decreased short-term memory Following Commands: Follows one step commands inconsistently;Follows one step commands with increased time Safety/Judgement: Decreased awareness of safety;Decreased awareness of deficits Awareness: Intellectual Problem Solving: Slow processing;Decreased initiation;Difficulty sequencing;Requires verbal cues;Requires tactile cues General Comments: pt stating [place] daughter in [place] but he doesn't have children    General Comments General comments (skin integrity, edema, etc.): pt with noted skin break down on R medial knee and L lateral hip    Exercises        Assessment/Plan    PT Assessment Patient needs continued PT services  PT Diagnosis Difficulty walking;Generalized weakness   PT Problem List Decreased strength;Decreased range of motion;Decreased activity tolerance;Decreased balance;Decreased mobility;Decreased knowledge of use of DME;Decreased safety awareness;Decreased coordination;Decreased knowledge of precautions  PT Treatment Interventions DME instruction;Gait training;Stair training;Functional mobility training;Therapeutic activities;Therapeutic exercise;Balance training;Neuromuscular re-education   PT Goals (Current goals can be found in the Care Plan section) Acute Rehab PT Goals Patient Stated Goal: didn't state PT Goal Formulation: With family Time For Goal Achievement: 12/10/15 Potential to Achieve Goals: Fair    Frequency Min 3X/week   Barriers to discharge Decreased caregiver support lives alone    Co-evaluation               End of Session Equipment Utilized During Treatment: Gait belt Activity Tolerance: Patient limited by fatigue Patient left: in chair;with call bell/phone within reach;with chair alarm set Nurse Communication: Mobility status         Time: [id number] PT Time Calculation (min) (ACUTE ONLY): 18 min   Charges:   PT Evaluation $PT  Eval Moderate Complexity: 1 Procedure     PT G CodesMarcene Brawn:        Torben Soloway Marie 11/26/2015, 11:32 AM  Lewis ShockAshly Kele Withem, PT, DPT Pager #: [phone] Office #: [phone]

## 2015-11-27 ENCOUNTER — Inpatient Hospital Stay (HOSPITAL_COMMUNITY): Payer: Medicaid Other

## 2015-11-27 ENCOUNTER — Ambulatory Visit (HOSPITAL_COMMUNITY): Payer: Medicaid Other

## 2015-11-27 DIAGNOSIS — E43 Unspecified severe protein-calorie malnutrition: Secondary | ICD-10-CM | POA: Insufficient documentation

## 2015-11-27 DIAGNOSIS — R0600 Dyspnea, unspecified: Secondary | ICD-10-CM

## 2015-11-27 DIAGNOSIS — R64 Cachexia: Secondary | ICD-10-CM | POA: Insufficient documentation

## 2015-11-27 DIAGNOSIS — I639 Cerebral infarction, unspecified: Secondary | ICD-10-CM | POA: Insufficient documentation

## 2015-11-27 DIAGNOSIS — R269 Unspecified abnormalities of gait and mobility: Secondary | ICD-10-CM | POA: Insufficient documentation

## 2015-11-27 LAB — BASIC METABOLIC PANEL
ANION GAP: 7 (ref 5–15)
BUN: 7 mg/dL (ref 6–20)
CALCIUM: 8.8 mg/dL — AB (ref 8.9–10.3)
CO2: 28 mmol/L (ref 22–32)
Chloride: 101 mmol/L (ref 101–111)
Creatinine, Ser: 0.46 mg/dL — ABNORMAL LOW (ref 0.61–1.24)
Glucose, Bld: 93 mg/dL (ref 65–99)
POTASSIUM: 3.8 mmol/L (ref 3.5–5.1)
SODIUM: 136 mmol/L (ref 135–145)

## 2015-11-27 LAB — LIPID PANEL
Cholesterol: 176 mg/dL (ref 0–200)
HDL: 51 mg/dL (ref >40)
LDL CALC: 110 mg/dL — AB (ref 0–99)
TRIGLYCERIDES: 76 mg/dL (ref <150)
Total CHOL/HDL Ratio: 3.5 RATIO
VLDL: 15 mg/dL (ref 0–40)

## 2015-11-27 LAB — ECHOCARDIOGRAM COMPLETE
A4CEF: 44 %
CHL CUP AORTIC ROOT 2D: 28 mm
CHL CUP LA SIZE INDEX: 2.66 mm/m2
CHL CUP LA VOL 2D: 46.6 mL
CHL CUP SV INDEX: 33.3 mL/m2
E decel time: 264 msec
EERAT: 12.55
FS: 29 % (ref 28–44)
IVS/LV PW RATIO, ED: 0.91
LA VOL 2D INDEX: 29.5 mL/m2
LA diam end sys: 42 mm
LA vol index: 39.5 mL/m2
LA vol: 62.4 mL
LASIZE: 42 mm
LDCA: 4.91 cm2
LV PW s: 8.8 mm
LV TDI E'LATERAL: 8.05 cm/s
LV TDI E'MEDIAL: 7.62 cm/s
LV dias vol: 109 mL (ref 62–150)
LV sys vol index: 36 mL/m2
LVDIAVOLIN: 69 mL/m2
LVIDD: 55 mm — AB (ref 3.5–6.0)
LVIDS: 39 mm — AB (ref 2.1–4.0)
LVOTD: 25 mm
LVSYSVOL: 109 mL — AB (ref 21–61)
MV Dec: 264 ms
MV pk A vel: 145 cm/s
MVPG: 4 mmHg
MVPKEVEL: 101 cm/s
P 1/2 time: 443 ms
PW: 8.8 mm — AB (ref 0.6–1.1)
Simpson's disk: 48
Stroke v: 53 ml
TR max vel: 264 cm/s
TV PEAK RV-RA GRADIENT: 28 cm/s
WEIGHTICAEL: 1947.1 [oz_av]

## 2015-11-27 LAB — CBC
HCT: 39.4 % (ref 39.0–52.0)
Hemoglobin: 13.1 g/dL (ref 13.0–17.0)
MCH: 30.7 pg (ref 26.0–34.0)
MCHC: 33.2 g/dL (ref 30.0–36.0)
MCV: 92.3 fL (ref 78.0–100.0)
PLATELETS: 422 10*3/uL — AB (ref 150–400)
RBC: 4.27 MIL/uL (ref 4.22–5.81)
RDW: 14.5 % (ref 11.5–15.5)
WBC: 16.9 10*3/uL — AB (ref 4.0–10.5)

## 2015-11-27 LAB — HEMOGLOBIN A1C
HEMOGLOBIN A1C: 5.6 % (ref 4.8–5.6)
MEAN PLASMA GLUCOSE: 114 mg/dL

## 2015-11-27 LAB — MAGNESIUM: MAGNESIUM: 1.8 mg/dL (ref 1.7–2.4)

## 2015-11-27 MED ORDER — ALBUTEROL SULFATE (2.5 MG/3ML) 0.083% IN NEBU
2.5000 mg | INHALATION_SOLUTION | Freq: Four times a day (QID) | RESPIRATORY_TRACT | Status: DC
  Administered 2015-11-27 – 2015-11-29 (×5): 2.5 mg via RESPIRATORY_TRACT
  Filled 2015-11-27 (×6): qty 3

## 2015-11-27 MED ORDER — PREDNISONE 50 MG PO TABS
50.0000 mg | ORAL_TABLET | Freq: Every day | ORAL | Status: DC
  Administered 2015-11-28: 50 mg via ORAL
  Filled 2015-11-27: qty 1

## 2015-11-27 MED ORDER — TIOTROPIUM BROMIDE MONOHYDRATE 18 MCG IN CAPS
18.0000 ug | ORAL_CAPSULE | Freq: Every day | RESPIRATORY_TRACT | Status: DC
  Filled 2015-11-27 (×2): qty 5

## 2015-11-27 MED ORDER — ALBUTEROL SULFATE (2.5 MG/3ML) 0.083% IN NEBU
2.5000 mg | INHALATION_SOLUTION | RESPIRATORY_TRACT | Status: AC
  Administered 2015-11-27: 2.5 mg via RESPIRATORY_TRACT
  Filled 2015-11-27: qty 3

## 2015-11-27 MED ORDER — LORAZEPAM 2 MG/ML IJ SOLN
0.5000 mg | Freq: Once | INTRAMUSCULAR | Status: AC
  Administered 2015-11-27: 0.5 mg via INTRAVENOUS
  Filled 2015-11-27: qty 1

## 2015-11-27 NOTE — Progress Notes (Signed)
  Echocardiogram 2D Echocardiogram has been performed.  Cody SavoyCasey N Lulamae Lloyd 11/27/2015, 4:50 PM

## 2015-11-27 NOTE — Progress Notes (Signed)
Interval History:                                                                                                                      Cody Lloyd is an 65 y.o. male patient admitted with recurrent falls, failure to thrive and poor home situation. Apparently his utilities were turned off 2 weeks ago with no electricity and water supply. He has generalized weakness in all 4 extremities worse in the legs, had severe gait dysfunction, and kyphosis. He also appears to have significant cognitive impairment, not oriented.    Past Medical History: Past Medical History  Diagnosis Date  . Stroke (cerebrum) [facility])     History reviewed. No pertinent past surgical history.  Family History: Family History  Problem Relation Age of Onset  . Hypertension Mother   . Hypertension Father     Social History:   has no tobacco, alcohol, and drug history on file.  Allergies:  No Known Allergies   Medications:                                                                                                                         Current facility-administered medications:  .  aspirin EC tablet 325 mg, 325 mg, Oral, Daily, Leroy Sea, MD, 325 mg at 11/27/15 0853 .  atorvastatin (LIPITOR) tablet 40 mg, 40 mg, Oral, q1800, Leroy Sea, MD, 40 mg at 11/26/15 1236 .  cefTRIAXone (ROCEPHIN) 1 g in dextrose 5 % 50 mL IVPB, 1 g, Intravenous, Q24H, Leroy Sea, MD, 1 g at 11/26/15 1231 .  enoxaparin (LOVENOX) injection 40 mg, 40 mg, Subcutaneous, Q24H, Bobette Mo, MD, 40 mg at 11/26/15 1232 .  feeding supplement (ENSURE ENLIVE) (ENSURE ENLIVE) liquid 237 mL, 237 mL, Oral, BID BM, Leroy Sea, MD, 237 mL at 11/27/15 0853 .  feeding supplement (PRO-STAT SUGAR FREE 64) liquid 30 mL, 30 mL, Oral, TID WC, Leroy Sea, MD, 30 mL at 11/27/15 0853 .  Influenza vac split quadrivalent PF (FLUARIX) injection 0.5 mL, 0.5 mL, Intramuscular, Tomorrow-1000, Bobette Mo, MD .   ipratropium-albuterol (DUONEB) 0.5-2.5 (3) MG/3ML nebulizer solution 3 mL, 3 mL, Nebulization, Q4H PRN, Bobette Mo, MD .  pneumococcal 23 valent vaccine (PNU-IMMUNE) injection 0.5 mL, 0.5 mL, Intramuscular, Tomorrow-1000, Bobette Mo, MD .  sodium chloride flush (NS) 0.9 % injection 3 mL, 3 mL, Intravenous, Q12H, Bobette Mo, MD, 3 mL at 11/26/15 2200   Neurologic  Examination:                                                                                                     Today's Vitals   11/26/15 1552 11/26/15 2004 11/27/15 0458 11/27/15 0818  BP: 116/56 120/63 122/68 147/71  Pulse: 76 82 80 79  Temp: 98.2 F (36.8 C) 98.4 F (36.9 C) 98.6 F (37 C) 97.7 F (36.5 C)  TempSrc: Axillary Axillary Axillary Oral  Resp: Weight:  55.2 kg (121 lb 11.1 oz)    SpO2: 95% 96% 98% 95%  PainSc:        Evaluation of higher integrative functions including: Level of alertness: Alert,  Oriented to place and person, not to time Speech: fluent, no evidence of dysarthria or aphasia noted.  Test the following cranial nerves: 2-12 grossly intact Motor examination: Generalized 4/5  weakness in all 4 extremities, unable to stand up by himself from the sitting position  Examination of sensation: Reports symmetric sensation to pinprick in all 4 extremities and on face Test coordination: Normal finger nose testing, with no evidence of limb appendicular ataxia or abnormal involuntary movements or tremors noted.  Gait : he was unable to stand up by himself  Lab Results: Basic Metabolic Panel:  Recent Labs Lab 11/26/15 0702 11/27/15 0742  NA 137 136  K 3.7 3.8  CL 99* 101  CO2 29 28  GLUCOSE 92 93  BUN 13 7  CREATININE 0.45* 0.46*  CALCIUM 8.8* 8.8*  MG 1.6* 1.8  PHOS 2.7  --     Liver Function Tests:  Recent Labs Lab 11/26/15 0702  AST 34  ALT 33  ALKPHOS 62  BILITOT 0.6  PROT 5.6*  ALBUMIN 2.7*   No results for input(s): LIPASE, AMYLASE in the  last 168 hours. No results for input(s): AMMONIA in the last 168 hours.  CBC:  Recent Labs Lab 11/26/15 0702 11/27/15 0742  WBC 18.1* 16.9*  HGB 11.2* 13.1  HCT 34.3* 39.4  MCV 92.5 92.3  PLT 352 422*    Cardiac Enzymes: No results for input(s): CKTOTAL, CKMB, CKMBINDEX, TROPONINI in the last 168 hours.  Lipid Panel:  Recent Labs Lab 11/26/15 0702 11/27/15 0742  CHOL 144 176  TRIG 50 76  HDL 45 51  CHOLHDL 3.2 3.5  VLDL 10 15  LDLCALC 89 110*    CBG: No results for input(s): GLUCAP in the last 168 hours.  Microbiology: No results found for this or any previous visit.  Imaging: Mr Sherrin Daisy Contrast  11/26/2015  CLINICAL DATA:  Decreasing intellectual abilities, assess possible normal pressure hydrocephalus. Frequent falls, failure to thrive. EXAM: MRI HEAD WITHOUT CONTRAST TECHNIQUE: Multiplanar, multiecho pulse sequences of the brain and surrounding structures were obtained without intravenous contrast. COMPARISON:  CT head November 22, 2015 and November 23, 2015 FINDINGS: Multiple sequences are moderately motion degraded. Punctate focus of reduced diffusion LEFT frontal centrum semiovale, difficult to localize on ADC value due to mild patient motion and small size. No susceptibility artifact to suggest hemorrhage. Moderate to severe ventriculomegaly,  disproportionate to degree of sulcal effacement. Patchy to confluent supratentorial/periventricular white matter T2 hyperintensities, in addition to mild pontine T2 hyperintense signal. Old small RIGHT cerebellar infarcts. Bifrontal multifocal encephalomalacia. No midline shift, mass effect or mass lesions though motion degrades sensitivity. No abnormal extra-axial fluid collections. Basal cisterns are patent. dolicoectatic intracranial vascular flow voids present at skullbase. Mild paranasal sinus mucosal thickening without air-fluid levels. The mastoid air cells are well aerated. No abnormal sellar expansion. No cerebellar tonsillar  ectopia. No suspicious calvarial bone marrow signal. IMPRESSION: Moderately motion degraded examination. Punctate probable acute infarct LEFT centrum semiovale. Moderate ventriculomegaly, consistent with mild brain atrophy for age and, a component normal pressure hydrocephalus. Bifrontal encephalomalacia in a pattern compatible with prior head injury. Moderate to severe chronic small vessel ischemic disease. Electronically Signed   By: Awilda Metroourtnay  Bloomer M.D.   On: 11/26/2015 05:08   Dg Chest Port 1 View  11/27/2015  CLINICAL DATA:  Shortness of breath.  No time course given. EXAM: PORTABLE CHEST 1 VIEW COMPARISON:  11/26/2015 FINDINGS: Examination is limited due to the rotation patient and the arms being down by the patient's side. The heart is borderline enlarged but stable. Moderate tortuosity and calcification of the thoracic aorta. Advanced emphysematous changes and pulmonary scarring. No acute pulmonary findings. No pleural effusion. No pneumothorax. IMPRESSION: Emphysematous changes and pulmonary scarring but no acute overlying pulmonary process. Electronically Signed   By: Rudie MeyerP.  Gallerani M.D.   On: 11/27/2015 09:01   Dg Chest Port 1 View  11/26/2015  CLINICAL DATA:  Cough and shortness of breath. EXAM: PORTABLE CHEST 1 VIEW COMPARISON:  CT chest 11/25/2015. Single view of the chest 11/24/2015. FINDINGS: The lungs are markedly emphysematous. Mild basilar atelectasis is noted. No pneumothorax or pleural effusion. Heart size is normal. IMPRESSION: Severe emphysema with bibasilar atelectasis. Electronically Signed   By: Drusilla Kannerhomas  Dalessio M.D.   On: 11/26/2015 07:46    Assessment and plan:   Cody Lloyd is an 65 y.o. male patient with with severe gait dysfunction and generalized weakness likely from cachexia. Brain MRI showed evidence of mild to moderate ventral megaly suspicious for normal pressure hydrocephalus. Patient does have gait dysfunction with cognitive impairment and incontinence. But the  severity of his clinical symptoms far exceed the severity of MR imaging appearance of the ventricles, and hence his clinical symptoms are likely multifactorial and not explained just based on the ventriculomegaly.   he was noted to have a punctate area of restricted diffusion on brain MRI. He is currently on aspirin 325 mg daily . TTE showed The estimated ejection fraction was in the range of 50% to 55%. There is hypokinesis of the basalinferior myocardium.  He does have severe kyphosis in upper thoracic spine, with brisk reflexes recommend MRI of the cervical spine to rule out significant spinal canal stenosis which could be contributing to his gait dysfunction and generalized weakness. Recommend physical therapy with gait training, and he would likely require placement in a skilled nursing facility .   we'll follow-up after completing the C-spine MRI.

## 2015-11-27 NOTE — Progress Notes (Signed)
Utilization review completed. Shantika Bermea, RN, BSN. 

## 2015-11-27 NOTE — Clinical Documentation Improvement (Addendum)
Internal Medicine  Can the diagnosis of altered mental status be further specified? Please document findings in next progress note NOT in BPA drop down box. Thank you!   Confusion/delirium (including drug induced)  Transient alteration of awareness  Encephalopathy - Alcoholic, Anoxic/Hypoxia, Drug Induced/Toxic (specify drug), Hepatic, Hypertensive, Hypoglycemic, Metabolic/Septic, Traumatic/post concussive, Wernicke, Other  Other  Clinically Undetermined  Document any associated diagnoses/conditions.  Supporting Information:  "His AMS is likely not secondary to his infarct" 3/14 Neuro Consult Note  WBC = 18.1 on admission; 16.9 this morning. On IV Rocephin q24h for possible UTI   Please exercise your independent, professional judgment when responding. A specific answer is not anticipated or expected.  Placed under discharge summary Debbora PrestoMAGICK-MYERS, ISKRA, MD  [facility] Pager [phone]  If 7PM-7AM, please contact night-coverage [email or web address] Password [email or web address]   Thank You,  Shellee MiloEileen T Warrington RN, BSN, CCDS Health Information Management [place] [phone]; Cell: [phone]

## 2015-11-27 NOTE — Progress Notes (Signed)
Patient Demographics:    Cody Lloyd, is a 65 y.o. male, DOB - 04/08/1951, ZHY:[id number]  Admit date - 11/25/2015   Admitting Physician Houston SirenPeter Le, MD  Outpatient Primary MD for the patient is No primary care provider on file.  LOS - 2   No chief complaint on file.       Subjective:    Cody NoonStephan Ensz today has, No headache, No chest pain, No abdominal pain - No Nausea, No new weakness tingling or numbness, No Cough - SOB.    Assessment  & Plan :     1.Failure to thrive and generalized weakness due to acute left centrum semiovale stroke along with undiagnosed NPH. Patient appears frail and cachectic, appears to be poorly kept, apparently lives alone and has had multiple falls recently.  Has been placed on aspirin, Lipitor, full stroke workup, neuro called. LDL was greater than 70 hence started on statin, stable A1c, stable carotid duplex, Echo pending.   2. Failure to thrive, cachexia, multiple falls recently. PT, OT eval, place on nutritional supplements, appears to have severe protein calorie malnutrition as well. Will likely require placement.  3. Dyslipidemia. Placed on statin.  4. UTI with leukocytosis. Monitor culture, placed on Rocephin.  5. Hypomagnesemia. Replace IV and recheck.  6. Smoking. Counseled to quit.  7. History of COPD. He has minimal wheezing this morning, will place him on oral prednisone along with Spiriva and nebulizer treatments and monitor.   8. Left hip pressure ulcer. Kindly see wound care note for details, one care consulted.  9. Undiagnosed Dementia with Delirium - supportive Rx.  10. H/O Hep C - follow with ID out patient     Code Status : Full  Family Communication  : sister over the phone on 11-27-15   Disposition Plan  : SNF  Consults  :   Neuro  Procedures  :   MRI brain. Acute left centrum semiovale stroke. 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 aspirin EC  325 mg Oral Daily  . atorvastatin  40 mg Oral q1800  . cefTRIAXone (ROCEPHIN)  IV  1 g Intravenous Q24H  . enoxaparin (LOVENOX) injection  40 mg Subcutaneous Q24H  . feeding supplement (ENSURE ENLIVE)  237 mL Oral BID BM  . feeding supplement (PRO-STAT SUGAR FREE 64)  30 mL Oral TID WC  . Influenza vac split quadrivalent PF  0.5 mL Intramuscular Tomorrow-1000  . LORazepam  0.5 mg Intravenous Once  . pneumococcal 23 valent vaccine  0.5 mL Intramuscular Tomorrow-1000  . sodium chloride flush  3 mL Intravenous Q12H   Continuous Infusions:  PRN Meds:.ipratropium-albuterol  Antibiotics  :     Anti-infectives    Start     Dose/Rate Route Frequency Ordered Stop   11/26/15 1200  cefTRIAXone (ROCEPHIN) 1 g in dextrose 5 % 50 mL IVPB     1 g 100 mL/hr over 30 Minutes Intravenous Every 24 hours 11/26/15 1020 12/01/15 1159        Objective:   Filed Vitals:   11/26/15 1552 11/26/15 2004 11/27/15 0458 11/27/15  0818  BP: 116/56 120/63 122/68 147/71  Pulse: 76 82 80 79  Temp: 98.2 F (36.8 C) 98.4 F (36.9 C) 98.6 F (37 C) 97.7 F (36.5 C)  TempSrc: Axillary Axillary Axillary Oral  Resp: Weight:  55.2 kg (121 lb 11.1 oz)    SpO2: 95% 96% 98% 95%    Wt Readings from Last 3 Encounters:  11/26/15 55.2 kg (121 lb 11.1 oz)     Intake/Output Summary (Last 24 hours) at 11/27/15 1138 Last data filed at 11/27/15 1000  Gross per 24 hour  Intake    240 ml  Output   1600 ml  Net  -1360 ml     Physical Exam  Frail elderly, cachectic appearing white male  Awake, Pleasantly confused, No new F.N deficits, Normal affect [place].AT,PERRAL Supple Neck,No JVD, No cervical lymphadenopathy appriciated.   Symmetrical Chest wall movement, Good air movement bilaterally, CTAB RRR,No Gallops,Rubs or new Murmurs, No Parasternal Heave +ve B.Sounds, Abd Soft, No tenderness, No organomegaly appriciated, No rebound - guarding or rigidity. No Cyanosis, Clubbing or edema, No new Rash or bruise       Data Review:   Micro Results No results found for this or any previous visit (from the past 240 hour(s)).  Radiology Reports Mr Brain Wo Contrast  11/26/2015  CLINICAL DATA:  Decreasing intellectual abilities, assess possible normal pressure hydrocephalus. Frequent falls, failure to thrive. EXAM: MRI HEAD WITHOUT CONTRAST TECHNIQUE: Multiplanar, multiecho pulse sequences of the brain and surrounding structures were obtained without intravenous contrast. COMPARISON:  CT head November 22, 2015 and November 23, 2015 FINDINGS: Multiple sequences are moderately motion degraded. Punctate focus of reduced diffusion LEFT frontal centrum semiovale, difficult to localize on ADC value due to mild patient motion and small size. No susceptibility artifact to suggest hemorrhage. Moderate to severe ventriculomegaly, disproportionate to degree of sulcal effacement. Patchy to confluent supratentorial/periventricular white matter T2 hyperintensities, in addition to mild pontine T2 hyperintense signal. Old small RIGHT cerebellar infarcts. Bifrontal multifocal encephalomalacia. No midline shift, mass effect or mass lesions though motion degrades sensitivity. No abnormal extra-axial fluid collections. Basal cisterns are patent. dolicoectatic intracranial vascular flow voids present at skullbase. Mild paranasal sinus mucosal thickening without air-fluid levels. The mastoid air cells are well aerated. No abnormal sellar expansion. No cerebellar tonsillar ectopia. No suspicious calvarial bone marrow signal. IMPRESSION: Moderately motion degraded examination. Punctate probable acute infarct LEFT centrum semiovale. Moderate ventriculomegaly,  consistent with mild brain atrophy for age and, a component normal pressure hydrocephalus. Bifrontal encephalomalacia in a pattern compatible with prior head injury. Moderate to severe chronic small vessel ischemic disease. Electronically Signed   By: Awilda Metro M.D.   On: 11/26/2015 05:08   Dg Chest Port 1 View  11/27/2015  CLINICAL DATA:  Shortness of breath.  No time course given. EXAM: PORTABLE CHEST 1 VIEW COMPARISON:  11/26/2015 FINDINGS: Examination is limited due to the rotation patient and the arms being down by the patient's side. The heart is borderline enlarged but stable. Moderate tortuosity and calcification of the thoracic aorta. Advanced emphysematous changes and pulmonary scarring. No acute pulmonary findings. No pleural effusion. No pneumothorax. IMPRESSION: Emphysematous changes and pulmonary scarring but no acute overlying pulmonary process. Electronically Signed   By: Rudie Meyer M.D.   On: 11/27/2015 09:01   Dg Chest Port 1 View  11/26/2015  CLINICAL DATA:  Cough and shortness of breath. EXAM: PORTABLE CHEST 1 VIEW COMPARISON:  CT chest 11/25/2015. Single view of the  chest 11/24/2015. FINDINGS: The lungs are markedly emphysematous. Mild basilar atelectasis is noted. No pneumothorax or pleural effusion. Heart size is normal. IMPRESSION: Severe emphysema with bibasilar atelectasis. Electronically Signed   By: Drusilla Kanner M.D.   On: 11/26/2015 07:46     CBC  Recent Labs Lab 11/26/15 0702 11/27/15 0742  WBC 18.1* 16.9*  HGB 11.2* 13.1  HCT 34.3* 39.4  PLT 352 422*  MCV 92.5 92.3  MCH 30.2 30.7  MCHC 32.7 33.2  RDW 14.5 14.5    Chemistries   Recent Labs Lab 11/26/15 0702 11/27/15 0742  NA 137 136  K 3.7 3.8  CL 99* 101  CO2 29 28  GLUCOSE 92 93  BUN 13 7  CREATININE 0.45* 0.46*  CALCIUM 8.8* 8.8*  MG 1.6* 1.8  AST 34  --   ALT 33  --   ALKPHOS 62  --   BILITOT 0.6  --     ------------------------------------------------------------------------------------------------------------------  Recent Labs  11/26/15 0702 11/27/15 0742  CHOL 144 176  HDL 45 51  LDLCALC 89 110*  TRIG 50 76  CHOLHDL 3.2 3.5    Lab Results  Component Value Date   HGBA1C 5.6 11/26/2015   ------------------------------------------------------------------------------------------------------------------  Recent Labs  11/26/15 0702  TSH 1.413   ------------------------------------------------------------------------------------------------------------------  Recent Labs  11/26/15 0702  VITAMINB12 393    Coagulation profile No results for input(s): INR, PROTIME in the last 168 hours.  No results for input(s): DDIMER in the last 72 hours.  Cardiac Enzymes No results for input(s): CKMB, TROPONINI, MYOGLOBIN in the last 168 hours.  Invalid input(s): CK ------------------------------------------------------------------------------------------------------------------ No results found for: BNP  Time Spent in minutes  35   Susa Raring K M.D on 11/27/2015 at 11:38 AM  Between 7am to 7pm - Pager - [phone]  After 7pm go to [email or web address] - password [facility]  [facility] -  Office  [phone]

## 2015-11-28 DIAGNOSIS — R64 Cachexia: Secondary | ICD-10-CM

## 2015-11-28 DIAGNOSIS — R930 Abnormal findings on diagnostic imaging of skull and head, not elsewhere classified: Secondary | ICD-10-CM

## 2015-11-28 DIAGNOSIS — R269 Unspecified abnormalities of gait and mobility: Secondary | ICD-10-CM

## 2015-11-28 LAB — URINE CULTURE

## 2015-11-28 LAB — HEMOGLOBIN A1C
HEMOGLOBIN A1C: 5.6 % (ref 4.8–5.6)
Mean Plasma Glucose: 114 mg/dL

## 2015-11-28 MED ORDER — CIPROFLOXACIN HCL 250 MG PO TABS
250.0000 mg | ORAL_TABLET | Freq: Two times a day (BID) | ORAL | Status: DC
  Administered 2015-11-28 – 2015-11-29 (×2): 250 mg via ORAL
  Filled 2015-11-28 (×2): qty 1

## 2015-11-28 MED ORDER — PREDNISONE 20 MG PO TABS
40.0000 mg | ORAL_TABLET | Freq: Every day | ORAL | Status: DC
  Administered 2015-11-29: 40 mg via ORAL
  Filled 2015-11-28: qty 2

## 2015-11-28 NOTE — Progress Notes (Signed)
Late entry Patient returned to floor and will not stay in bed. Patient brought to the nurses' station but kept getting up. NP on call notified for restraints, posey and wrist restraints applied per order.

## 2015-11-28 NOTE — Progress Notes (Signed)
Physical Therapy Treatment Patient Details Name: Cody Lloyd MRN: [id number] DOB: 1951/06/20 Today's Date: 11/28/2015    History of Present Illness Cody Lloyd is a 65 y.o. male with a past medical history of, secondary to his stroke with subsequent decreasing intellectual abilities who was referred by [facility] for questionable NPH finding in CT scan of the head.MRI brain was obtained and showed a punctate stroke    PT Comments    Worked on standing and getting COG over BOS due to heavy posterior lean.  Unable to ambulate due to lean.  Lateral weight shifts caused pt to feel unsteady.  Pt participated well with PT and did all that was asked of him.  Con't to recommend SNF.  Follow Up Recommendations  SNF;Supervision/Assistance - 24 hour     Equipment Recommendations  None recommended by PT    Recommendations for Other Services       Precautions / Restrictions Precautions Precautions: Fall Restrictions Weight Bearing Restrictions: No    Mobility  Bed Mobility Overal bed mobility: Needs Assistance Bed Mobility: Supine to Sit;Sit to Supine     Supine to sit: Min assist;+2 for physical assistance Sit to supine: Min assist   General bed mobility comments: A for trunk  Transfers Overall transfer level: Needs assistance Equipment used: Rolling walker (2 wheeled) Transfers: Sit to/from Stand Sit to Stand: Mod assist;+2 physical assistance         General transfer comment: Sit > stand x 2 with multi-modal cueing for technique. Did better on 2nd rep, but still needing +2 A   Ambulation/Gait             General Gait Details: unable to amb due to posterior lean in standing   Stairs            Wheelchair Mobility    Modified Rankin (Stroke Patients Only)       Balance Overall balance assessment: Needs assistance Sitting-balance support: Feet supported Sitting balance-Leahy Scale: Fair Sitting balance - Comments: sat EOB with no LOB    Standing balance support: Bilateral upper extremity supported Standing balance-Leahy Scale: Poor Standing balance comment: Heavy posterior lean. Worked on weight shifting forward and posture. Cues to widen BOS.  Side to sdie weight shifting which pt stated made him feel too unsteady.  Did better on 2nd stand, but still with lean, but able to get COG over BOS for a little longer.                    Cognition Arousal/Alertness: Awake/alert Behavior During Therapy: Flat affect Overall Cognitive Status: Impaired/Different from baseline Area of Impairment: Orientation;Attention;Memory;Following commands;Safety/judgement;Awareness;Problem solving Orientation Level: Disoriented to;Time;Situation;Place Current Attention Level: Focused Memory: Decreased short-term memory Following Commands: Follows one step commands inconsistently;Follows one step commands with increased time Safety/Judgement: Decreased awareness of safety;Decreased awareness of deficits Awareness: Intellectual Problem Solving: Slow processing;Decreased initiation;Difficulty sequencing;Requires verbal cues;Requires tactile cues General Comments: Thinks he is in [place]    Exercises      General Comments General comments (skin integrity, edema, etc.): Pt in restraints today      Pertinent Vitals/Pain Pain Assessment: Faces Faces Pain Scale: No hurt    Home Living                      Prior Function            PT Goals (current goals can now be found in the care plan section) Acute Rehab PT Goals Patient Stated  Goal: didn't state PT Goal Formulation: With family Time For Goal Achievement: 12/10/15 Potential to Achieve Goals: Fair Progress towards PT goals: Progressing toward goals    Frequency  Min 3X/week    PT Plan Current plan remains appropriate    Co-evaluation             End of Session Equipment Utilized During Treatment: Gait belt Activity Tolerance: Patient limited by  fatigue Patient left: in bed;with nursing/sitter in room;with restraints reapplied (NT assisting pt with set up for breaksfast)     Time: [id number] PT Time Calculation (min) (ACUTE ONLY): 26 min  Charges:  $Therapeutic Activity: 8-22 mins $Neuromuscular Re-education: 8-22 mins                    G Codes:      Willoughby Doell LUBECK 11/28/2015, 10:32 AM

## 2015-11-28 NOTE — Progress Notes (Signed)
Late entry MRI unsuccessful, patient kept getting up per radiology. Ativan given prior to procedure. NP on call notified.

## 2015-11-28 NOTE — Progress Notes (Signed)
Speech Language Pathology Treatment: Dysphagia  Patient Details Name: Cody Lloyd MRN: [id number] DOB: 07/16/1951 Today's Date: 11/28/2015 Time: [id number] SLP Time Calculation (min) (ACUTE ONLY): 15 min  Assessment / Plan / Recommendation Clinical Impression  Pt has consistent throat clearing after sips of water, not reduced with removal of straw. Mod cues from SLP or smaller, single sips were effective at reducing overt signs of penetration/aspiration. Mastication of regular textures is swift and adequate. CXR from previous date is without acute changes. Recommend to continue current diet with full supervision. SLP to f/u an additional time due to findings today. MD may wish to consider SLP cognitive-linguistic evaluation.   HPI HPI: Cody Lloyd is a 65 y.o. 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  Per [facility], patient was seen for the second time in the last 3 days in the emergency department. Earlier today, one of his neighbors was concerned, because the patient was not answering his daughter. Subsequently neighbor called help and the patient was found down in urine and feces on the floor. He apparently has been having frequent falls lately. At his baseline, the patient was independent enough to drive a vehicle and do grocery shopping, but apparently has not been eating much lately and seems to be unable to live independently at this time.  [facility] showed a negative CT chest angiogram for PE or pneumonia with no interval change from the one done earlier this week. His CT scan of the head showed involutional changes with prominent ventricles, NPH could not be ruled out. He was transferred to this facility for further evaluation and neurology consult.  MRI dated 11/26/2015 showing punctate probable acute infarct LEFT  centrum semiovale,  moderate ventriculomegaly, consistent with mild brain atrophy for age and, a component normal pressure hydrocephalus, bifrontal encephalomalacia in a pattern compatible with prior head injury and moderate to severe chronic small vessel ischemic disease.      SLP Plan  Continue with current plan of care     Recommendations  Diet recommendations: Regular;Thin liquid Liquids provided via: Cup Medication Administration: Whole meds with puree Supervision: Patient able to self feed;Full supervision/cueing for compensatory strategies Compensations: Minimize environmental distractions;Slow rate;Small sips/bites Postural Changes and/or Swallow Maneuvers: Seated upright 90 degrees;Upright 30-60 min after meal             Oral Care Recommendations: Oral care BID Follow up Recommendations:  (tba) Plan: Continue with current plan of care     GO               Maxcine HamLaura Paiewonsky, M.A. CCC-SLP [phone]  Maxcine Hamaiewonsky, Cody Lloyd 11/28/2015, 3:46 PM

## 2015-11-28 NOTE — Progress Notes (Signed)
CSW discussed pt's disposition with his sister, Fulton Molelice, who agrees that pt will need NHP at d/c.  Alice consents to a NH bed search and prefers [facility]  Bed search initiated, full assessment to follow.  Pollyann SavoyJody Zia Najera, [place] [phone]

## 2015-11-28 NOTE — Progress Notes (Signed)
Pharmacy Antibiotic Note  Cody Lloyd is a 65 y.o. male admitted on 11/25/2015 with CONS UTI.  Pharmacy has been consulted for Cipro dosing.  Now day #3 of Rocephin for CONS UTI. Sensitivities are back. Paged MD and discussed to switch IV Rocephin to PO cipro since patient doing well. Afebrile, WBC down to 16.9.  Plan: Will give one more dose of ceftriaxone today then stop Start ciprofloxacin 250mg  PO BID tonight Finish 7 day course of abx on 3/19  Weight: 119 lb (53.978 kg)  Temp (24hrs), Avg:98.1 F (36.7 C), Min:98 F (36.7 C), Max:98.2 F (36.8 C)   Recent Labs Lab 11/26/15 0702 11/27/15 0742  WBC 18.1* 16.9*  CREATININE 0.45* 0.46*    CrCl cannot be calculated (Unknown ideal weight.).    No Known Allergies   Thank you for allowing pharmacy to be a part of this patient's care.  Cody Lloyd, PharmD, BCPS Clinical Pharmacist Pager [phone] 11/28/2015 11:33 AM

## 2015-11-28 NOTE — Clinical Social Work Note (Signed)
CSW spoke by phone with [facility] social worker Murvin DonningCheryl Lackey [phone]) regarding patient as they received APS referral after patient found by police and taken to hospital. Per Ms. Lackey: patient lives alone, has had no running water or electricity for 2 weeks, his sister Jonathon Resides(Alice Girod) pay his rent out of her pocket, however patient has not been paying his utilities, patient would not let sister in his home or answer the phone. Per Ms. Duke SalviaLackey, the neighbors called police as they had not seen patient and he was found by police. Per Ms. Duke SalviaLackey, she will be following patient.   Genelle BalVanessa Tita Terhaar, MSW, LCSW Licensed Clinical Social Worker Clinical Social Work Department [facility] [phone]

## 2015-11-28 NOTE — [place] FL2 (Signed)
[place] MEDICAID FL2 LEVEL OF CARE SCREENING TOOL     IDENTIFICATION  Patient Name: Cody Lloyd Birthdate: 02/13/1951 Sex: male Admission Date (Current Location): 11/25/2015  [place]ody [place]  [facility] [id number] S Facility and Address:  [facility] [place], [name], [name], [name]  Attending Physician Name and Address:  Dorothea OgleIskra M Myers, MD  Relative Name and Phone Number:  Cody Lloyd - sister. Phone #[phone] (mobile)    Current Level of Care: Hospital Recommended Level of Care: Skilled Nursing Facility Prior Approval Number:    Date Approved/Denied:   PASRR Number: [id number]   (Eff. 11/24/15)  Discharge Plan: SNF    Current Diagnoses: Patient Active Problem List   Diagnosis Date Noted  . Protein-calorie malnutrition, severe 11/27/2015  . Acute embolic stroke (HCC)   . Cachexia (HCC)   . Abnormality of gait   . Abnormal CT scan of head 11/26/2015  . Left hip pressure ulcer 11/26/2015  . Failure to thrive in adult 11/25/2015  . Emphysema/COPD (HCC) 11/25/2015  . Tobacco abuse disorder 11/25/2015  . Frequent falls 11/25/2015    Orientation RESPIRATION BLADDER Height & Weight     Self  Normal Incontinent, External catheter Weight: 119 lb (53.978 kg) Height:     BEHAVIORAL SYMPTOMS/MOOD NEUROLOGICAL BOWEL NUTRITION STATUS      Incontinent Diet  AMBULATORY STATUS COMMUNICATION OF NEEDS Skin   Extensive Assist (Patient unable to ambulate with PTon 3/13) Verbally PU Stage and Appropriate Care (Stage 2 pressure ulcer to left hip, with silicone foam (chg every 3 days & PRN for soilage)                       Personal Care Assistance Level of Assistance  Bathing, Feeding, Dressing Bathing Assistance: Maximum assistance Feeding assistance: Independent Dressing Assistance: Maximum assistance     Functional Limitations Info  Sight, Hearing, Speech Sight Info:  Adequate Hearing Info: Adequate Speech Info: Adequate    SPECIAL CARE FACTORS FREQUENCY  PT (By licensed PT), OT (By licensed OT), Speech therapy     PT Frequency: Evaluated 3/13. Minimum of 3X per week recommended OT Frequency: Evaluated 3/13. Minimum of 2X per week recommended     Speech Therapy Frequency: Evaluated 3/13.      Contractures Contractures Info: Not present    Additional Factors Info  Code Status, Allergies Code Status Info: Full code Allergies Info: No allergies on file           Current Medications (11/28/2015):  This is the current hospital active medication list Current Facility-Administered Medications  Medication Dose Route Frequency Provider Last Rate Last Dose  . albuterol (PROVENTIL) (2.5 MG/3ML) 0.083% nebulizer solution 2.5 mg  2.5 mg Nebulization QID Leroy SeaPrashant K Singh, MD   2.5 mg at 11/28/15 0841  . aspirin EC tablet 325 mg  325 mg Oral Daily Leroy SeaPrashant K Singh, MD   325 mg at 11/28/15 1031  . atorvastatin (LIPITOR) tablet 40 mg  40 mg Oral q1800 Leroy SeaPrashant K Singh, MD   40 mg at 11/27/15 1749  . cefTRIAXone (ROCEPHIN) 1 g in dextrose 5 % 50 mL IVPB  1 g Intravenous Q24H Leroy SeaPrashant K Singh, MD   1 g at 11/27/15 1224  . enoxaparin (LOVENOX) injection 40 mg  40 mg Subcutaneous Q24H Bobette Moavid Manuel Ortiz, MD   40 mg at 11/27/15 1223  . feeding supplement (ENSURE ENLIVE) (ENSURE ENLIVE) liquid 237 mL  237 mL Oral BID BM Leroy Sea, MD   237 mL at 11/28/15 1039  . feeding supplement (PRO-STAT SUGAR FREE 64) liquid 30 mL  30 mL Oral TID WC Leroy Sea, MD   30 mL at 11/28/15 1032  . Influenza vac split quadrivalent PF (FLUARIX) injection 0.5 mL  0.5 mL Intramuscular Tomorrow-1000 Bobette Mo, MD      . ipratropium-albuterol (DUONEB) 0.5-2.5 (3) MG/3ML nebulizer solution 3 mL  3 mL Nebulization Q4H PRN Bobette Mo, MD      . pneumococcal 23 valent vaccine (PNU-IMMUNE) injection 0.5 mL  0.5 mL Intramuscular Tomorrow-1000 Bobette Mo, MD       . predniSONE (DELTASONE) tablet 50 mg  50 mg Oral Q breakfast Leroy Sea, MD   50 mg at 11/28/15 1031  . sodium chloride flush (NS) 0.9 % injection 3 mL  3 mL Intravenous Q12H Bobette Mo, MD   3 mL at 11/28/15 1033  . tiotropium (SPIRIVA) inhalation capsule 18 mcg  18 mcg Inhalation Daily Leroy Sea, MD   18 mcg at 11/27/15 1230     Discharge Medications: Please see discharge summary for a list of discharge medications.  Relevant Imaging Results:  Relevant Lab Results:   Additional Information ss#[id number]  Cristobal Goldmann, LCSW

## 2015-11-28 NOTE — Progress Notes (Addendum)
Patient ID: Cody Lloyd, male   DOB: 18-Mar-1951, 65 y.o.   MRN: [id number]  [facility] PROGRESS NOTE  Ademola Vert XBM:[id number] DOB: [birth year] DOA: 11/25/2015 PCP: No primary care provider on file.   Brief narrative:    65 y.o. male transferred from [facility] for further evaluation of confusion and weakness, stroke work up.   Assessment/Plan:    1. Failure to thrive and generalized weakness due to acute left centrum semiovale stroke along with undiagnosed NPH. Patient appears frail and cachectic, appears to be poorly kept, apparently lives alone and has had multiple falls in the past 6 months.  Required significant assistance with ambulation, needs SNF placement   2. Multiple falls recently, severe PCM PT, OT eval, place on nutritional supplements, appears to have severe protein calorie malnutrition as well.   3. Dyslipidemia. Essential HTN Placed on statin. Add hydralazine scheduled and as needed.   4. UTI with leukocytosis.Staph, coag negative Was on Rocephin, no sensitivity to Rocephin. Started Cipro 3/15 and continue for 7 more days  5. Hypomagnesemia. Supplemented and WNL  6. Smoking. Counseled to quit.  7. Acute COPD. No wheezing, maintaining oxygen saturation at target range  Taper down Prednisone  Continue BD's scheduled and as needed   8. Left hip pressure ulcer. Please see wound care note for details, consulted.  9. Undiagnosed Dementia with Delirium  - supportive Rx.  10. H/O Hep C follow with ID out patient  DVT prophylaxis - Lovenox Sq  Code Status: Full.  Family Communication:  plan of care discussed with the patient Disposition Plan: SNF by 3/16 if bed available   IV access:  Peripheral IV  Procedures and diagnostic studies:    Mr Brain Wo Contrast 2015-12-18  Moderately motion degraded examination. Punctate probable acute infarct LEFT centrum semiovale. Moderate ventriculomegaly, consistent with mild brain atrophy for age  and, a component normal pressure hydrocephalus. Bifrontal encephalomalacia in a pattern compatible with prior head injury. Moderate to severe chronic small vessel ischemic disease.   Dg Chest Port 1 View 11/27/2015  Emphysematous changes and pulmonary scarring but no acute overlying pulmonary process.   Dg Chest Port 1 View 2015-12-18  Severe emphysema with bibasilar atelectasis.  Medical Consultants:  Neurology   Other Consultants:  PT/SLP  IAnti-Infectives:   Rocephin --> transition to Cipro 3/15 --> 3/21  Debbora Presto, MD  [facility] Pager [phone]  If 7PM-7AM, please contact night-coverage [email or web address] Password [facility] 11/28/2015, 11:31 AM   LOS: 3 days   HPI/Subjective: No events overnight.   Objective: Filed Vitals:   11/27/15 1937 11/27/15 2100 11/28/15 0420 11/28/15 0900  BP:  143/75 148/79 172/84  Pulse:  72 70 74  Temp:  98.2 F (36.8 C) 98 F (36.7 C) 98.2 F (36.8 C)  TempSrc:   Oral Oral  Resp:  Weight:  53.978 kg (119 lb)    SpO2: 96% 95% 97% 98%    Intake/Output Summary (Last 24 hours) at 11/28/15 1131 Last data filed at 11/28/15 0900  Gross per 24 hour  Intake    580 ml  Output    550 ml  Net     30 ml    Exam:   General:  Pt is alert, follows commands appropriately, not in acute distress  Cardiovascular: Regular rate and rhythm, no rubs, no gallops  Respiratory: Clear to auscultation bilaterally, no wheezing, no crackles, no rhonchi  Abdomen: Soft, non tender, non distended, bowel sounds present, no guarding  Data Reviewed: Basic  Metabolic Panel:  Recent Labs Lab 11/26/15 0702 11/27/15 0742  NA 137 136  K 3.7 3.8  CL 99* 101  CO2 29 28  GLUCOSE 92 93  BUN 13 7  CREATININE 0.45* 0.46*  CALCIUM 8.8* 8.8*  MG 1.6* 1.8  PHOS 2.7  --    Liver Function Tests:  Recent Labs Lab 11/26/15 0702  AST 34  ALT 33  ALKPHOS 62  BILITOT 0.6  PROT 5.6*  ALBUMIN 2.7*   CBC:  Recent Labs Lab 11/26/15 0702  11/27/15 0742  WBC 18.1* 16.9*  HGB 11.2* 13.1  HCT 34.3* 39.4  MCV 92.5 92.3  PLT 352 422*    Recent Results (from the past 240 hour(s))  Urine culture     Status: None   Collection Time: 11/26/15 12:50 PM  Result Value Ref Range Status   Specimen Description URINE, RANDOM  Final   Special Requests NONE  Final   Culture   Final    >=100,000 COLONIES/mL STAPHYLOCOCCUS SPECIES (COAGULASE NEGATIVE)   Report Status 11/28/2015 FINAL  Final   Organism ID, Bacteria STAPHYLOCOCCUS SPECIES (COAGULASE NEGATIVE)  Final      Susceptibility   Staphylococcus species (coagulase negative) - MIC*    CIPROFLOXACIN <=0.5 SENSITIVE Sensitive     GENTAMICIN <=0.5 SENSITIVE Sensitive     NITROFURANTOIN <=16 SENSITIVE Sensitive     OXACILLIN <=0.25 SENSITIVE Sensitive     TETRACYCLINE 2 SENSITIVE Sensitive     VANCOMYCIN <=0.5 SENSITIVE Sensitive     TRIMETH/SULFA 160 RESISTANT Resistant     CLINDAMYCIN <=0.25 SENSITIVE Sensitive     RIFAMPIN <=0.5 SENSITIVE Sensitive     Inducible Clindamycin NEGATIVE Sensitive     * >=100,000 COLONIES/mL STAPHYLOCOCCUS SPECIES (COAGULASE NEGATIVE)     Scheduled Meds: . albuterol  2.5 mg Nebulization QID  . aspirin EC  325 mg Oral Daily  . atorvastatin  40 mg Oral q1800  . cefTRIAXone (ROCEPHIN)  IV  1 g Intravenous Q24H  . enoxaparin (LOVENOX) injection  40 mg Subcutaneous Q24H  . predniSONE  50 mg Oral Q breakfast  . tiotropium  18 mcg Inhalation Daily   Continuous Infusions:

## 2015-11-29 DIAGNOSIS — J438 Other emphysema: Secondary | ICD-10-CM

## 2015-11-29 LAB — BASIC METABOLIC PANEL
Anion gap: 8 (ref 5–15)
BUN: 24 mg/dL — AB (ref 6–20)
CALCIUM: 8.9 mg/dL (ref 8.9–10.3)
CO2: 28 mmol/L (ref 22–32)
CREATININE: 0.51 mg/dL — AB (ref 0.61–1.24)
Chloride: 99 mmol/L — ABNORMAL LOW (ref 101–111)
GFR calc Af Amer: >60 mL/min (ref >60)
GFR calc non Af Amer: >60 mL/min (ref >60)
GLUCOSE: 89 mg/dL (ref 65–99)
Potassium: 3.8 mmol/L (ref 3.5–5.1)
Sodium: 135 mmol/L (ref 135–145)

## 2015-11-29 LAB — CBC
HEMATOCRIT: 36.9 % — AB (ref 39.0–52.0)
Hemoglobin: 12.7 g/dL — ABNORMAL LOW (ref 13.0–17.0)
MCH: 31.6 pg (ref 26.0–34.0)
MCHC: 34.4 g/dL (ref 30.0–36.0)
MCV: 91.8 fL (ref 78.0–100.0)
Platelets: 462 10*3/uL — ABNORMAL HIGH (ref 150–400)
RBC: 4.02 MIL/uL — ABNORMAL LOW (ref 4.22–5.81)
RDW: 14.4 % (ref 11.5–15.5)
WBC: 14.5 10*3/uL — ABNORMAL HIGH (ref 4.0–10.5)

## 2015-11-29 MED ORDER — PREDNISONE 10 MG PO TABS: ORAL_TABLET | ORAL | Status: AC

## 2015-11-29 MED ORDER — ATORVASTATIN CALCIUM 40 MG PO TABS: 40.0000 mg | ORAL_TABLET | Freq: Every day | ORAL | Status: AC

## 2015-11-29 MED ORDER — TIOTROPIUM BROMIDE MONOHYDRATE 18 MCG IN CAPS: 18.0000 ug | ORAL_CAPSULE | Freq: Every day | RESPIRATORY_TRACT | Status: AC

## 2015-11-29 MED ORDER — HYDRALAZINE HCL 10 MG PO TABS: 10.0000 mg | ORAL_TABLET | Freq: Three times a day (TID) | ORAL | Status: AC

## 2015-11-29 MED ORDER — CIPROFLOXACIN HCL 250 MG PO TABS: 250.0000 mg | ORAL_TABLET | Freq: Two times a day (BID) | ORAL | Status: AC

## 2015-11-29 MED ORDER — ALBUTEROL SULFATE (2.5 MG/3ML) 0.083% IN NEBU: 2.5000 mg | INHALATION_SOLUTION | RESPIRATORY_TRACT | Status: AC | PRN

## 2015-11-29 MED ORDER — PRO-STAT SUGAR FREE PO LIQD: 30.0000 mL | Freq: Three times a day (TID) | ORAL | Status: AC

## 2015-11-29 MED ORDER — ALBUTEROL SULFATE (2.5 MG/3ML) 0.083% IN NEBU: 2.5000 mg | INHALATION_SOLUTION | RESPIRATORY_TRACT | Status: DC | PRN

## 2015-11-29 MED ORDER — ASPIRIN 325 MG PO TBEC: 325.0000 mg | DELAYED_RELEASE_TABLET | Freq: Every day | ORAL | Status: AC

## 2015-11-29 MED ORDER — ENSURE ENLIVE PO LIQD: 237.0000 mL | Freq: Two times a day (BID) | ORAL | Status: AC

## 2015-11-29 NOTE — Discharge Summary (Addendum)
Physician Discharge Summary  Reynol Arnone ZOX:[id number] DOB: [birth year] DOA: 11/25/2015  PCP: No primary care provider on file.  Admit date: 11/25/2015 Discharge date: 11/29/2015  Recommendations for Outpatient Follow-up:  1. Pt will need to follow up with PCP in 1-2 weeks post discharge 2. Please obtain BMP to evaluate electrolytes and kidney function 3. Please also check CBC to evaluate Hg and Hct levels 4. Complete therapy with Cipro for 6 more days post discharge 5. Continue to taper down Prednisone  6. Please arrange ID follow up given pt's history of Hep C, non urgent 7. Also please wound care instructions under hospital course section  8. Please note that we were not able to complete cervical spine MRI, pt was not cooperating and has eventually refused   Discharge Diagnoses:  Principal Problem:   Failure to thrive in adult Active Problems:   Emphysema/COPD (HCC)   Tobacco abuse disorder   Frequent falls   Abnormal CT scan of head   Left hip pressure ulcer   Protein-calorie malnutrition, severe   Acute embolic stroke (HCC)   Cachexia (HCC)   Abnormality of gait  Discharge Condition: Stable  Diet recommendation: Heart healthy diet discussed in details   Brief narrative:    65 y.o. male transferred from [facility] hospital for further evaluation of confusion and weakness, stroke work up.   Assessment/Plan:    1. Failure to thrive and generalized weakness due to acute left centrum semiovale stroke along with undiagnosed NPH. Patient appears frail and cachectic, appears to be poorly kept, apparently lives alone and has had multiple falls in the past 6 months. Required significant assistance with ambulation, needs SNF placement and is ready for discharge when bed available.   2. Multiple falls recently, severe PCM PT, OT eval, place on nutritional supplements, appears to have severe protein calorie malnutrition as well. Encourage oral intake and provide nutritional  supplementation.   3. Dyslipidemia. Essential HTN Placed on statin. Added hydralazine for better BP control   4. UTI with leukocytosis.Staph, coag negative Was on Rocephin, no sensitivity to Rocephin. Started Cipro 3/15 and continue for 6 more days post discharge.   5. Hypomagnesemia. Supplemented and WNL  6. Smoking. Counseled to quit.  7. Acute COPD. No wheezing, maintaining oxygen saturation at target range  Taper down Prednisone  Continue BD's as needed   8. Left hip pressure ulcer. Stage II Present on admission  3cm x 4cm x 0.2cm  Wound bed: 50% pink with epithelial buds, 50% yellow but just fibrin Drainage minimal, serous Dressing procedure/placement/frequency: continue silicone foam dressing to protect and insulate for moist wound healing.   9. Undiagnosed Dementia with Delirium/Acute encephalopathy, metabolic  Likely exacerbated by underlying dementia and acute UTI, acute CVA and NPH as seen on MRI brain  MRI also consistent with mod to severe small vessel ischemic disease   10. H/O Hep C Follow with ID out patient  11. Thrombocytosis Reactive  Code Status: Full.  Family Communication: plan of care discussed with the patient Disposition Plan: SNF   IV access:  Peripheral IV  Procedures and diagnostic studies:   Mr Brain Wo Contrast 11/26/2015 Moderately motion degraded examination. Punctate probable acute infarct LEFT centrum semiovale. Moderate ventriculomegaly, consistent with mild brain atrophy for age and, a component normal pressure hydrocephalus. Bifrontal encephalomalacia in a pattern compatible with prior head injury. Moderate to severe chronic small vessel ischemic disease.   Dg Chest Port 1 View 11/27/2015 Emphysematous changes and pulmonary scarring but no acute overlying pulmonary  process.   Dg Chest Port 1 View 11/26/2015 Severe emphysema with bibasilar atelectasis.  Medical Consultants:  Neurology   Other Consultants:   PT/SLP  IAnti-Infectives:   Rocephin --> transition to Cipro 3/15 --> 3/21     Discharge Exam: Filed Vitals:   11/29/15 0600 11/29/15 0628  BP:  133/48  Pulse: 73 73  Temp: 98 F (36.7 C) 98 F (36.7 C)  Resp:  16   Filed Vitals:   11/28/15 2147 11/29/15 0600 11/29/15 0628 11/29/15 0747  BP: 150/71  133/48   Pulse: 87 73 73   Temp: 97.5 F (36.4 C) 98 F (36.7 C) 98 F (36.7 C)   TempSrc: Axillary Axillary Axillary   Resp: 20  16   Weight:      SpO2: 98%  98% 98%   General: Pt is alert, follows commands appropriately, not in acute distress Cardiovascular: Regular rate and rhythm, no rubs, no gallops Respiratory: Clear to auscultation bilaterally, diminished breath sounds  Abdominal: Soft, non tender, non distended, bowel sounds +, no guarding  Discharge Instructions  Discharge Instructions    Diet - low sodium heart healthy    Complete by:  As directed      Increase activity slowly    Complete by:  As directed             Medication List    TAKE these medications        albuterol (2.5 MG/3ML) 0.083% nebulizer solution  Commonly known as:  PROVENTIL  Take 3 mLs (2.5 mg total) by nebulization every 4 (four) hours as needed for wheezing or shortness of breath.     aspirin 325 MG EC tablet  Take 1 tablet (325 mg total) by mouth daily.     atorvastatin 40 MG tablet  Commonly known as:  LIPITOR  Take 1 tablet (40 mg total) by mouth daily at 6 PM.     ciprofloxacin 250 MG tablet  Commonly known as:  CIPRO  Take 1 tablet (250 mg total) by mouth 2 (two) times daily.     feeding supplement (ENSURE ENLIVE) Liqd  Take 237 mLs by mouth 2 (two) times daily between meals.     feeding supplement (PRO-STAT SUGAR FREE 64) Liqd  Take 30 mLs by mouth 3 (three) times daily with meals.     hydrALAZINE 10 MG tablet  Commonly known as:  APRESOLINE  Take 1 tablet (10 mg total) by mouth 3 (three) times daily.     predniSONE 10 MG tablet  Commonly known as:   DELTASONE  Take 40 mg tablet 11/30/2015 and taper down by 10 mg daily until completed     tiotropium 18 MCG inhalation capsule  Commonly known as:  SPIRIVA  Place 1 capsule (18 mcg total) into inhaler and inhale daily.             Follow-up Information    Follow up with Debbora Presto, MD.   Specialty:  Internal Medicine   Why:  As needed   Contact information:   [address] [place] [address] [phone]      The results of significant diagnostics from this hospitalization (including imaging, microbiology, ancillary and laboratory) are listed below for reference.    Microbiology: Recent Results (from the past 240 hour(s))  Urine culture     Status: None   Collection Time: 11/26/15 12:50 PM  Result Value Ref Range Status   Specimen Description URINE, RANDOM  Final   Special  Requests NONE  Final   Culture   Final    >=100,000 COLONIES/mL STAPHYLOCOCCUS SPECIES (COAGULASE NEGATIVE)   Report Status 11/28/2015 FINAL  Final   Organism ID, Bacteria STAPHYLOCOCCUS SPECIES (COAGULASE NEGATIVE)  Final      Susceptibility   Staphylococcus species (coagulase negative) - MIC*    CIPROFLOXACIN <=0.5 SENSITIVE Sensitive     GENTAMICIN <=0.5 SENSITIVE Sensitive     NITROFURANTOIN <=16 SENSITIVE Sensitive     OXACILLIN <=0.25 SENSITIVE Sensitive     TETRACYCLINE 2 SENSITIVE Sensitive     VANCOMYCIN <=0.5 SENSITIVE Sensitive     TRIMETH/SULFA 160 RESISTANT Resistant     CLINDAMYCIN <=0.25 SENSITIVE Sensitive     RIFAMPIN <=0.5 SENSITIVE Sensitive     Inducible Clindamycin NEGATIVE Sensitive     * >=100,000 COLONIES/mL STAPHYLOCOCCUS SPECIES (COAGULASE NEGATIVE)     Labs: Basic Metabolic Panel:  Recent Labs Lab 11/26/15 0702 11/27/15 0742 11/29/15 0609  NA 137 136 135  K 3.7 3.8 3.8  CL 99* 101 99*  CO2 29 28 28   GLUCOSE 92 93 89  BUN 13 7 24*  CREATININE 0.45* 0.46* 0.51*  CALCIUM 8.8* 8.8* 8.9  MG 1.6* 1.8  --   PHOS 2.7  --   --     Liver Function Tests:  Recent Labs Lab 11/26/15 0702  AST 34  ALT 33  ALKPHOS 62  BILITOT 0.6  PROT 5.6*  ALBUMIN 2.7*   CBC:  Recent Labs Lab 11/26/15 0702 11/27/15 0742 11/29/15 0609  WBC 18.1* 16.9* 14.5*  HGB 11.2* 13.1 12.7*  HCT 34.3* 39.4 36.9*  MCV 92.5 92.3 91.8  PLT 352 422* 462*   SIGNED: Time coordinating discharge:  30 minutes  MAGICK-Taahir Grisby, MD  [facility] 11/29/2015, 10:44 AM Pager [phone]  If 7PM-7AM, please contact night-coverage [email or web address] Password [email or web address]

## 2015-11-29 NOTE — Progress Notes (Signed)
Occupational Therapy Treatment Patient Details Name: Cody Lloyd Avedisian MRN: [id number] DOB: 06/07/1951 Today's Date: 11/29/2015    History of present illness Cody Lloyd Mathews is a 65 y.o. male with a past medical history of, secondary to his stroke with subsequent decreasing intellectual abilities who was referred by [facility] for questionable NPH finding in CT scan of the head.MRI brain was obtained and showed a punctate stroke   OT comments  Patient with limited participation today. OT will continue to follow. Continue to recommend SNF.  Follow Up Recommendations  SNF;Supervision/Assistance - 24 hour    Equipment Recommendations  Other (comment) (tbd at SNF)    Recommendations for Other Services      Precautions / Restrictions Precautions Precautions: Fall Restrictions Weight Bearing Restrictions: No       Mobility Bed Mobility                  Transfers                      Balance                                   ADL Overall ADL's : Needs assistance/impaired     Grooming: Wash/dry hands;Wash/dry face;Total assistance                                 General ADL Comments: Patient sleepy upon arrival, does verbally respond occasionally. States "no" when asked to wash face. Placed wash cloth on face and patient did not respond. Allowed therapist to wipe eyes. Attempted supine to sit, pt got half way then yelled, "Ow!" and got back to supine. Refused to attempt again. Assisted patient to reposition catheter which was wrapped around his leg. Patient left in bed.      Vision                     Perception     Praxis      Cognition   Behavior During Therapy: Flat affect Overall Cognitive Status: Impaired/Different from baseline Area of Impairment: Orientation;Attention;Memory;Following commands;Safety/judgement;Awareness;Problem solving Orientation Level: Disoriented to;Time;Situation;Place Current  Attention Level: Focused Memory: Decreased short-term memory  Following Commands: Follows one step commands inconsistently;Follows one step commands with increased time Safety/Judgement: Decreased awareness of safety;Decreased awareness of deficits Awareness: Intellectual Problem Solving: Slow processing;Decreased initiation;Difficulty sequencing;Requires verbal cues;Requires tactile cues      Extremity/Trunk Assessment               Exercises     Shoulder Instructions       General Comments      Pertinent Vitals/ Pain       Pain Assessment: No/denies pain  Home Living                                          Prior Functioning/Environment              Frequency Min 2X/week     Progress Toward Goals  OT Goals(current goals can now be found in the care plan section)  Progress towards OT goals: OT to reassess next treatment  Acute Rehab OT Goals Patient Stated Goal: didn't state  Plan Discharge plan remains appropriate    Co-evaluation  End of Session     Activity Tolerance Patient limited by lethargy   Patient Left in bed;with call bell/phone within reach;with bed alarm set   Nurse Communication          Time: 4098-1191 OT Time Calculation (min): 10 min  Charges: OT General Charges $OT Visit: 1 Procedure OT Treatments $Therapeutic Activity: 8-22 mins  Oralia Criger A 11/29/2015, 11:20 AM

## 2015-11-29 NOTE — Clinical Social Work Note (Signed)
Clinical Social Work Assessment  Patient Details  Name: Cody Lloyd MRN: [id number] Date of Birth: 03/14/1951  Date of referral:  11/28/15               Reason for consult:  Facility Placement                Permission sought to share information with:    Permission granted to share information::  Yes, Verbal Permission Granted  Name::        Agency::     Relationship::     Contact Information:     Housing/Transportation Living arrangements for the past 2 months:  Apartment Source of Information:  Other (Comment Required) (sister) Patient Interpreter Needed:  None Criminal Activity/Legal Involvement Pertinent to Current Situation/Hospitalization:  No - Comment as needed Significant Relationships:    Lives with:  Siblings Do you feel safe going back to the place where you live?  No Need for family participation in patient care:  No (Coment)  Care giving concerns: CSW spoke with pt's sister re: discharge planning for pt.  Per sister, pt lived alone pta and has refused to allow her in his home/speak with her x 1 year.  She believes she was giving his friends his debit card to buy him (junk) food and and pay his utilities, which they had not been doing.  Pt's sister is in the process of cleaning up pt's apartment and explains that, at this time, is is uninhabitable.  Pt's sister supports NHP for pt and does not think he needs to be living by himself any longer.  Pt has no POA, but sister is his closest relative.  Sister is agreeable to placement and [place].  Social Worker assessment / plan: CSW will begin bed search and facilitate NH tx, as appropriate. Employment status:  Disabled (Comment on whether or not currently receiving Disability) Insurance information:  [place] Recommendations:  Skilled Nursing Facility Information / Referral to community resources:     Patient/Family's Response to care: Agreeable to NHP and states that "pt  doesn't have a choice" at this time due to the condition of his home.  She states that she and her husband are older than pt and cannot take pt in at d/c. Patient/Family's Understanding of and Emotional Response to Diagnosis, Current Treatment, and Prognosis:  Pt's sister has been receiving daily updates from MD/RN, which she appreciates.  She has not been to visit pt here, but remarks that he did not recognize her when he was at [facility].  Sister appreciative of CSW involvement with pt and the placement process.  Emotional Assessment Appearance:   (not assessed) Attitude/Demeanor/Rapport:  Unable to Assess Affect (typically observed):  Unable to Assess Orientation:    Alcohol / Substance use:    Psych involvement (Current and /or in the community):  No (Comment)  Discharge Needs  Concerns to be addressed:  Discharge Planning Concerns Readmission within the last 30 days:  No Current discharge risk:  Lives alone, Cognitively Impaired, Physical Impairment Barriers to Discharge:  Inadequate or no insurance (medicaid only)   Linna CapriceDRAKE, Ezekeil Bethel M, LCSW 11/29/2015, 3:02 PM

## 2015-11-29 NOTE — Progress Notes (Signed)
11/29/2015 7:50 AM  Rapid response RN, Eunice Blaseebbie, notified with request for NIH stroke scale assessment.  RN to assess patient as soon as possible.  Will monitor. Theadora RamaKIRKMAN, Mica Releford Brooke

## 2015-11-29 NOTE — Progress Notes (Signed)
Attempted to perform NIHHS scale - patient very sleepy - arouses but only pushes me away - wants to be "left alone"  Moves all extremities - but will not co-operate with exam.  Allyson RN notified - she reports patient was up all night - and alert at that time - will return when patient awake and can cooperate.  RN to call.

## 2015-11-29 NOTE — Discharge Instructions (Signed)

## 2015-11-29 NOTE — Care Management Note (Signed)
Case Management Note  Patient Details  Name: Cody Lloyd MRN: [id number] Date of Birth: 07/22/1951  Subjective/Objective:                 Patient admitted with FTT   Action/Plan:  Will DC to SNF today Expected Discharge Date:                  Expected Discharge Plan:  Skilled Nursing Facility  In-House Referral:  Clinical Social Work  Discharge planning Services  CM Consult  Post Acute Care Choice:    Choice offered to:     DME Arranged:    DME Agency:     HH Arranged:    HH Agency:     Status of Service:  Completed, signed off  Medicare Important Message Given:    Date Medicare IM Given:    Medicare IM give by:    Date Additional Medicare IM Given:    Additional Medicare Important Message give by:     If discussed at Long Length of Stay Meetings, dates discussed:    Additional Comments:  Lawerance SabalDebbie Shaul Trautman, RN 11/29/2015, 12:53 PM

## 2015-11-29 NOTE — Clinical Social Work Placement (Signed)
   CLINICAL SOCIAL WORK PLACEMENT  NOTE  11-29-15  [facility]   Date:  11/29/2015  Patient Details  Name: Cody Lloyd MRN: [id number] Date of Birth: 09/20/1950  Clinical Social Work is seeking post-discharge placement for this patient at the Skilled  Nursing Facility level of care (*CSW will initial, date and re-position this form in  chart as items are completed):  Yes   Patient/family provided with [facility] Clinical Social Work Department's list of facilities offering this level of care within the geographic area requested by the patient (or if unable, by the patient's family).  Yes   Patient/family informed of their freedom to choose among providers that offer the needed level of care, that participate in Medicare, Medicaid or managed care program needed by the patient, have an available bed and are willing to accept the patient.  Yes   Patient/family informed of [facility]'s ownership interest in [facility] and [facility], as well as of the fact that they are under no obligation to receive care at these facilities.  PASRR submitted to EDS on       PASRR number received on 11/24/15     Existing PASRR number confirmed on       FL2 transmitted to all facilities in geographic area requested by pt/family on 11/28/15     FL2 transmitted to all facilities within larger geographic area on 11/28/15     Patient informed that his/her managed care company has contracts with or will negotiate with certain facilities, including the following:        Yes   Patient/family informed of bed offers received.  [facility] status 11/29/15 - sister notified  Physician recommends and patient chooses bed at      [facility]  11/29/15.  Patient to be transferred to facility by  ambulance     Patient family notified on  11/30/15 of transfer.  Name of family  member notified:   Fanny BienSister, Alice Girod - [phone]- [phone]. Completed admissions paperwork for patient.     PHYSICIAN Please prepare priority discharge summary, including medications, Please sign FL2     Additional Comment:    _______________________________________________ Baldo DaubJolan Williams, Student-SW - Initiated placement note, CSW completed for discharge. 11/29/2015, 4:41 PM

## 2016-07-13 IMAGING — CR DG CHEST 1V PORT
2 series · 2 of 2 positions shown · non-contrast
Comparison: 11/26/2015

CLINICAL DATA: Shortness of breath.  No time course given.

EXAM:
PORTABLE CHEST 1 VIEW

[portable (1 of 2)]
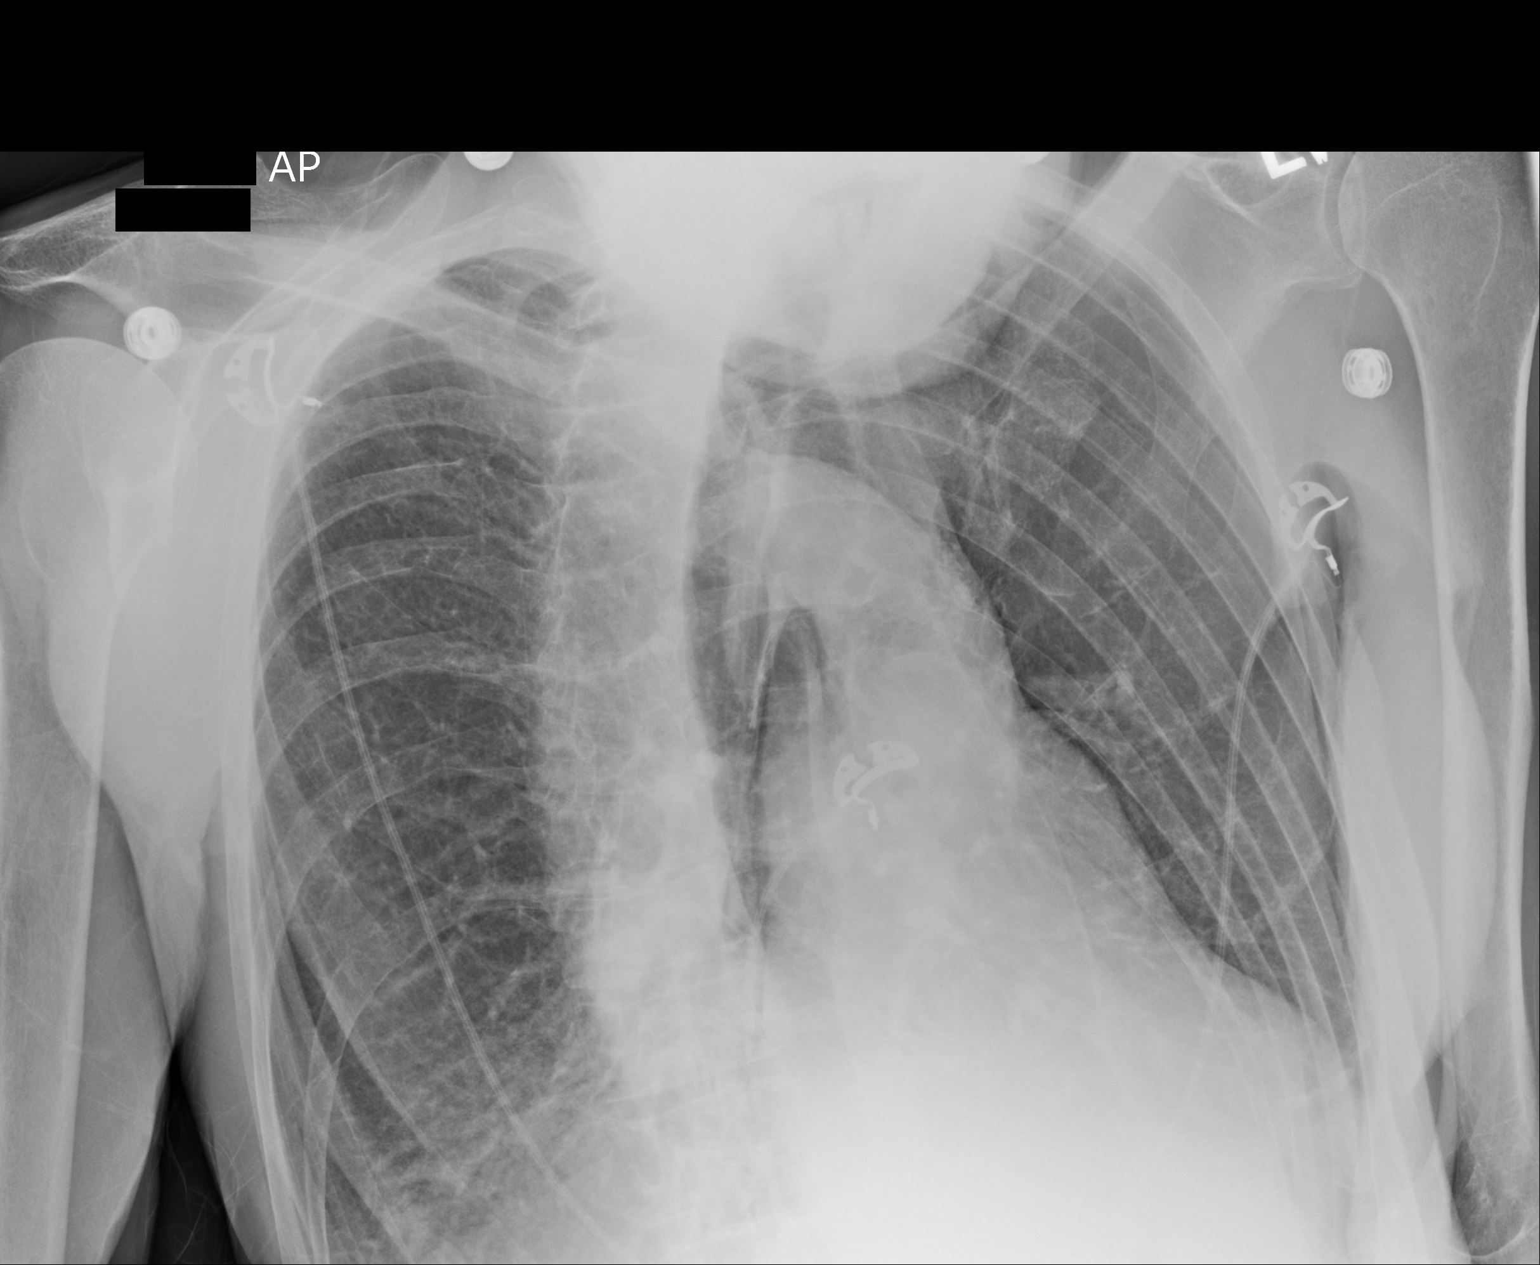

[portable (2 of 2)]
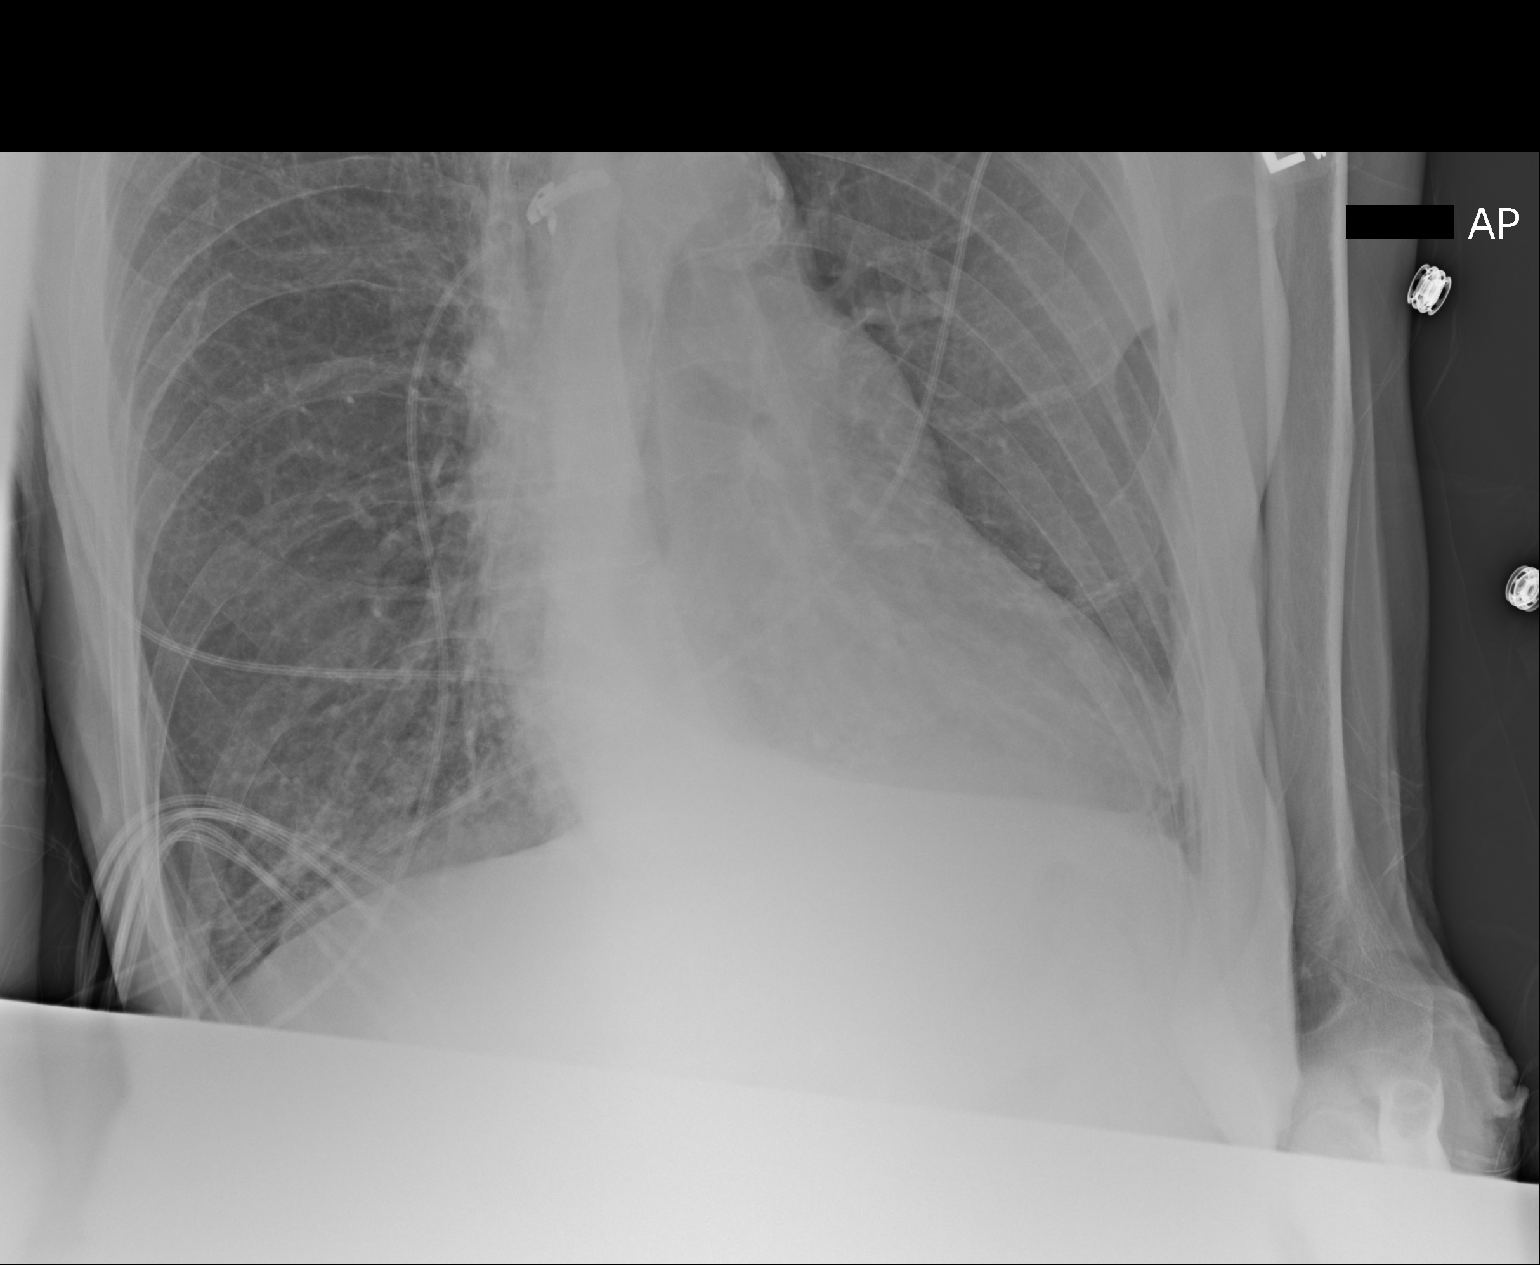

[2 of 2 positions shown; findings below may reference images not displayed]

FINDINGS: Examination is limited due to the rotation patient and the arms
being down by the patient's side. The heart is borderline enlarged
but stable. Moderate tortuosity and calcification of the thoracic
aorta. Advanced emphysematous changes and pulmonary scarring. No
acute pulmonary findings. No pleural effusion. No pneumothorax.
IMPRESSION: Emphysematous changes and pulmonary scarring but no acute overlying
pulmonary process.

## 2018-08-02 DIAGNOSIS — I5032 Chronic diastolic (congestive) heart failure: Secondary | ICD-10-CM | POA: Diagnosis not present

## 2018-08-02 DIAGNOSIS — J441 Chronic obstructive pulmonary disease with (acute) exacerbation: Secondary | ICD-10-CM

## 2018-08-02 DIAGNOSIS — J189 Pneumonia, unspecified organism: Secondary | ICD-10-CM | POA: Diagnosis not present

## 2018-08-02 DIAGNOSIS — J9621 Acute and chronic respiratory failure with hypoxia: Secondary | ICD-10-CM | POA: Diagnosis not present

## 2018-08-03 DIAGNOSIS — J189 Pneumonia, unspecified organism: Secondary | ICD-10-CM | POA: Diagnosis not present

## 2018-08-03 DIAGNOSIS — J9621 Acute and chronic respiratory failure with hypoxia: Secondary | ICD-10-CM | POA: Diagnosis not present

## 2018-08-03 DIAGNOSIS — I5032 Chronic diastolic (congestive) heart failure: Secondary | ICD-10-CM | POA: Diagnosis not present

## 2018-08-03 DIAGNOSIS — J441 Chronic obstructive pulmonary disease with (acute) exacerbation: Secondary | ICD-10-CM | POA: Diagnosis not present

## 2018-08-04 DIAGNOSIS — J441 Chronic obstructive pulmonary disease with (acute) exacerbation: Secondary | ICD-10-CM

## 2018-08-04 DIAGNOSIS — J9621 Acute and chronic respiratory failure with hypoxia: Secondary | ICD-10-CM | POA: Diagnosis not present

## 2018-08-04 DIAGNOSIS — J189 Pneumonia, unspecified organism: Secondary | ICD-10-CM | POA: Diagnosis not present

## 2018-08-04 DIAGNOSIS — I5032 Chronic diastolic (congestive) heart failure: Secondary | ICD-10-CM | POA: Diagnosis not present

## 2018-08-05 DIAGNOSIS — J9621 Acute and chronic respiratory failure with hypoxia: Secondary | ICD-10-CM

## 2018-08-05 DIAGNOSIS — J189 Pneumonia, unspecified organism: Secondary | ICD-10-CM

## 2018-08-05 DIAGNOSIS — I5032 Chronic diastolic (congestive) heart failure: Secondary | ICD-10-CM | POA: Diagnosis not present

## 2018-08-05 DIAGNOSIS — J441 Chronic obstructive pulmonary disease with (acute) exacerbation: Secondary | ICD-10-CM | POA: Diagnosis not present

## 2018-08-06 DIAGNOSIS — J9621 Acute and chronic respiratory failure with hypoxia: Secondary | ICD-10-CM

## 2018-08-06 DIAGNOSIS — I5032 Chronic diastolic (congestive) heart failure: Secondary | ICD-10-CM | POA: Diagnosis not present

## 2018-08-06 DIAGNOSIS — J441 Chronic obstructive pulmonary disease with (acute) exacerbation: Secondary | ICD-10-CM | POA: Diagnosis not present

## 2018-08-06 DIAGNOSIS — J189 Pneumonia, unspecified organism: Secondary | ICD-10-CM

## 2018-08-07 DIAGNOSIS — J441 Chronic obstructive pulmonary disease with (acute) exacerbation: Secondary | ICD-10-CM

## 2018-08-07 DIAGNOSIS — J9621 Acute and chronic respiratory failure with hypoxia: Secondary | ICD-10-CM | POA: Diagnosis not present

## 2018-08-07 DIAGNOSIS — I5032 Chronic diastolic (congestive) heart failure: Secondary | ICD-10-CM

## 2018-08-07 DIAGNOSIS — J189 Pneumonia, unspecified organism: Secondary | ICD-10-CM

## 2018-08-08 DIAGNOSIS — J189 Pneumonia, unspecified organism: Secondary | ICD-10-CM | POA: Diagnosis not present

## 2018-08-08 DIAGNOSIS — J9621 Acute and chronic respiratory failure with hypoxia: Secondary | ICD-10-CM

## 2018-08-08 DIAGNOSIS — I5032 Chronic diastolic (congestive) heart failure: Secondary | ICD-10-CM | POA: Diagnosis not present

## 2018-08-08 DIAGNOSIS — J441 Chronic obstructive pulmonary disease with (acute) exacerbation: Secondary | ICD-10-CM | POA: Diagnosis not present

## 2018-08-16 DIAGNOSIS — J9621 Acute and chronic respiratory failure with hypoxia: Secondary | ICD-10-CM | POA: Diagnosis not present

## 2018-08-16 DIAGNOSIS — I5032 Chronic diastolic (congestive) heart failure: Secondary | ICD-10-CM | POA: Diagnosis not present

## 2018-08-16 DIAGNOSIS — J189 Pneumonia, unspecified organism: Secondary | ICD-10-CM | POA: Diagnosis not present

## 2018-08-16 DIAGNOSIS — J441 Chronic obstructive pulmonary disease with (acute) exacerbation: Secondary | ICD-10-CM | POA: Diagnosis not present

## 2018-08-17 DIAGNOSIS — J9621 Acute and chronic respiratory failure with hypoxia: Secondary | ICD-10-CM | POA: Diagnosis not present

## 2018-08-17 DIAGNOSIS — I5032 Chronic diastolic (congestive) heart failure: Secondary | ICD-10-CM | POA: Diagnosis not present

## 2018-08-17 DIAGNOSIS — J189 Pneumonia, unspecified organism: Secondary | ICD-10-CM | POA: Diagnosis not present

## 2018-08-17 DIAGNOSIS — J441 Chronic obstructive pulmonary disease with (acute) exacerbation: Secondary | ICD-10-CM

## 2018-08-18 DIAGNOSIS — J9621 Acute and chronic respiratory failure with hypoxia: Secondary | ICD-10-CM

## 2018-08-18 DIAGNOSIS — I5032 Chronic diastolic (congestive) heart failure: Secondary | ICD-10-CM | POA: Diagnosis not present

## 2018-08-18 DIAGNOSIS — J189 Pneumonia, unspecified organism: Secondary | ICD-10-CM | POA: Diagnosis not present

## 2018-08-18 DIAGNOSIS — J441 Chronic obstructive pulmonary disease with (acute) exacerbation: Secondary | ICD-10-CM

## 2018-08-19 DIAGNOSIS — J189 Pneumonia, unspecified organism: Secondary | ICD-10-CM | POA: Diagnosis not present

## 2018-08-19 DIAGNOSIS — J9621 Acute and chronic respiratory failure with hypoxia: Secondary | ICD-10-CM | POA: Diagnosis not present

## 2018-08-19 DIAGNOSIS — J441 Chronic obstructive pulmonary disease with (acute) exacerbation: Secondary | ICD-10-CM | POA: Diagnosis not present

## 2018-08-19 DIAGNOSIS — I5032 Chronic diastolic (congestive) heart failure: Secondary | ICD-10-CM

## 2018-08-20 DIAGNOSIS — I5032 Chronic diastolic (congestive) heart failure: Secondary | ICD-10-CM

## 2018-08-20 DIAGNOSIS — J189 Pneumonia, unspecified organism: Secondary | ICD-10-CM

## 2018-08-20 DIAGNOSIS — J9621 Acute and chronic respiratory failure with hypoxia: Secondary | ICD-10-CM | POA: Diagnosis not present

## 2018-08-20 DIAGNOSIS — J441 Chronic obstructive pulmonary disease with (acute) exacerbation: Secondary | ICD-10-CM

## 2018-08-21 DIAGNOSIS — J189 Pneumonia, unspecified organism: Secondary | ICD-10-CM

## 2018-08-21 DIAGNOSIS — J441 Chronic obstructive pulmonary disease with (acute) exacerbation: Secondary | ICD-10-CM

## 2018-08-21 DIAGNOSIS — J9621 Acute and chronic respiratory failure with hypoxia: Secondary | ICD-10-CM

## 2018-08-21 DIAGNOSIS — I5032 Chronic diastolic (congestive) heart failure: Secondary | ICD-10-CM

## 2018-08-22 DIAGNOSIS — J189 Pneumonia, unspecified organism: Secondary | ICD-10-CM | POA: Diagnosis not present

## 2018-08-22 DIAGNOSIS — J9621 Acute and chronic respiratory failure with hypoxia: Secondary | ICD-10-CM | POA: Diagnosis not present

## 2018-08-22 DIAGNOSIS — I5032 Chronic diastolic (congestive) heart failure: Secondary | ICD-10-CM | POA: Diagnosis not present

## 2018-08-22 DIAGNOSIS — J441 Chronic obstructive pulmonary disease with (acute) exacerbation: Secondary | ICD-10-CM | POA: Diagnosis not present

## 2018-08-30 DIAGNOSIS — J441 Chronic obstructive pulmonary disease with (acute) exacerbation: Secondary | ICD-10-CM | POA: Diagnosis not present

## 2018-08-30 DIAGNOSIS — I5032 Chronic diastolic (congestive) heart failure: Secondary | ICD-10-CM | POA: Diagnosis not present

## 2018-08-30 DIAGNOSIS — J189 Pneumonia, unspecified organism: Secondary | ICD-10-CM

## 2018-08-30 DIAGNOSIS — J9621 Acute and chronic respiratory failure with hypoxia: Secondary | ICD-10-CM

## 2018-08-31 DIAGNOSIS — J189 Pneumonia, unspecified organism: Secondary | ICD-10-CM | POA: Diagnosis not present

## 2018-08-31 DIAGNOSIS — J9621 Acute and chronic respiratory failure with hypoxia: Secondary | ICD-10-CM | POA: Diagnosis not present

## 2018-08-31 DIAGNOSIS — I5032 Chronic diastolic (congestive) heart failure: Secondary | ICD-10-CM

## 2018-08-31 DIAGNOSIS — J441 Chronic obstructive pulmonary disease with (acute) exacerbation: Secondary | ICD-10-CM

## 2018-09-01 DIAGNOSIS — J189 Pneumonia, unspecified organism: Secondary | ICD-10-CM

## 2018-09-01 DIAGNOSIS — J9621 Acute and chronic respiratory failure with hypoxia: Secondary | ICD-10-CM

## 2018-09-01 DIAGNOSIS — I5032 Chronic diastolic (congestive) heart failure: Secondary | ICD-10-CM | POA: Diagnosis not present

## 2018-09-01 DIAGNOSIS — J441 Chronic obstructive pulmonary disease with (acute) exacerbation: Secondary | ICD-10-CM | POA: Diagnosis not present

## 2018-09-02 DIAGNOSIS — J189 Pneumonia, unspecified organism: Secondary | ICD-10-CM

## 2018-09-02 DIAGNOSIS — J441 Chronic obstructive pulmonary disease with (acute) exacerbation: Secondary | ICD-10-CM

## 2018-09-02 DIAGNOSIS — J9621 Acute and chronic respiratory failure with hypoxia: Secondary | ICD-10-CM | POA: Diagnosis not present

## 2018-09-02 DIAGNOSIS — I5032 Chronic diastolic (congestive) heart failure: Secondary | ICD-10-CM

## 2018-09-03 DIAGNOSIS — J441 Chronic obstructive pulmonary disease with (acute) exacerbation: Secondary | ICD-10-CM | POA: Diagnosis not present

## 2018-09-03 DIAGNOSIS — I5032 Chronic diastolic (congestive) heart failure: Secondary | ICD-10-CM

## 2018-09-03 DIAGNOSIS — J189 Pneumonia, unspecified organism: Secondary | ICD-10-CM

## 2018-09-03 DIAGNOSIS — J9621 Acute and chronic respiratory failure with hypoxia: Secondary | ICD-10-CM | POA: Diagnosis not present

## 2018-09-04 DIAGNOSIS — J9621 Acute and chronic respiratory failure with hypoxia: Secondary | ICD-10-CM

## 2018-09-04 DIAGNOSIS — I5032 Chronic diastolic (congestive) heart failure: Secondary | ICD-10-CM

## 2018-09-04 DIAGNOSIS — J189 Pneumonia, unspecified organism: Secondary | ICD-10-CM

## 2018-09-04 DIAGNOSIS — J441 Chronic obstructive pulmonary disease with (acute) exacerbation: Secondary | ICD-10-CM

## 2018-09-05 DIAGNOSIS — J9621 Acute and chronic respiratory failure with hypoxia: Secondary | ICD-10-CM | POA: Diagnosis not present

## 2018-09-05 DIAGNOSIS — J189 Pneumonia, unspecified organism: Secondary | ICD-10-CM | POA: Diagnosis not present

## 2018-09-05 DIAGNOSIS — J441 Chronic obstructive pulmonary disease with (acute) exacerbation: Secondary | ICD-10-CM

## 2018-09-05 DIAGNOSIS — I5032 Chronic diastolic (congestive) heart failure: Secondary | ICD-10-CM

## 2018-09-06 DIAGNOSIS — J441 Chronic obstructive pulmonary disease with (acute) exacerbation: Secondary | ICD-10-CM | POA: Diagnosis not present

## 2018-09-06 DIAGNOSIS — J9621 Acute and chronic respiratory failure with hypoxia: Secondary | ICD-10-CM | POA: Diagnosis not present

## 2018-09-06 DIAGNOSIS — I5032 Chronic diastolic (congestive) heart failure: Secondary | ICD-10-CM | POA: Diagnosis not present

## 2018-09-06 DIAGNOSIS — J189 Pneumonia, unspecified organism: Secondary | ICD-10-CM

## 2018-09-07 DIAGNOSIS — J189 Pneumonia, unspecified organism: Secondary | ICD-10-CM | POA: Diagnosis not present

## 2018-09-07 DIAGNOSIS — I5032 Chronic diastolic (congestive) heart failure: Secondary | ICD-10-CM | POA: Diagnosis not present

## 2018-09-07 DIAGNOSIS — J441 Chronic obstructive pulmonary disease with (acute) exacerbation: Secondary | ICD-10-CM | POA: Diagnosis not present

## 2018-09-07 DIAGNOSIS — J9621 Acute and chronic respiratory failure with hypoxia: Secondary | ICD-10-CM

## 2018-09-08 DIAGNOSIS — J189 Pneumonia, unspecified organism: Secondary | ICD-10-CM | POA: Diagnosis not present

## 2018-09-08 DIAGNOSIS — J441 Chronic obstructive pulmonary disease with (acute) exacerbation: Secondary | ICD-10-CM

## 2018-09-08 DIAGNOSIS — I5032 Chronic diastolic (congestive) heart failure: Secondary | ICD-10-CM | POA: Diagnosis not present

## 2018-09-08 DIAGNOSIS — J9621 Acute and chronic respiratory failure with hypoxia: Secondary | ICD-10-CM

## 2019-03-16 DEATH — deceased
